# Patient Record
Sex: Female | Born: 1955 | Race: White | Hispanic: No | Marital: Married | Smoking: Never smoker
Health system: Southern US, Community
[De-identification: ages and names within clinical notes are randomized; demographics above are authoritative.]

## PROBLEM LIST (undated history)

## (undated) DIAGNOSIS — K279 Peptic ulcer, site unspecified, unspecified as acute or chronic, without hemorrhage or perforation: Secondary | ICD-10-CM

## (undated) DIAGNOSIS — R0609 Other forms of dyspnea: Secondary | ICD-10-CM

## (undated) DIAGNOSIS — R079 Chest pain, unspecified: Secondary | ICD-10-CM

## (undated) DIAGNOSIS — I10 Essential (primary) hypertension: Secondary | ICD-10-CM

## (undated) DIAGNOSIS — F419 Anxiety disorder, unspecified: Secondary | ICD-10-CM

## (undated) DIAGNOSIS — E039 Hypothyroidism, unspecified: Secondary | ICD-10-CM

## (undated) DIAGNOSIS — R0600 Dyspnea, unspecified: Secondary | ICD-10-CM

## (undated) DIAGNOSIS — E785 Hyperlipidemia, unspecified: Secondary | ICD-10-CM

## (undated) HISTORY — DX: Other forms of dyspnea: R06.09

## (undated) HISTORY — DX: Dyspnea, unspecified: R06.00

## (undated) HISTORY — DX: Hypothyroidism, unspecified: E03.9

## (undated) HISTORY — DX: Chest pain, unspecified: R07.9

## (undated) HISTORY — DX: Hyperlipidemia, unspecified: E78.5

## (undated) HISTORY — DX: Peptic ulcer, site unspecified, unspecified as acute or chronic, without hemorrhage or perforation: K27.9

## (undated) HISTORY — DX: Anxiety disorder, unspecified: F41.9

## (undated) HISTORY — DX: Essential (primary) hypertension: I10

## (undated) HISTORY — PX: EYE SURGERY: SHX253

---

## 2004-05-21 ENCOUNTER — Ambulatory Visit: Payer: Self-pay

## 2005-04-14 ENCOUNTER — Ambulatory Visit: Payer: Self-pay

## 2006-01-07 ENCOUNTER — Ambulatory Visit: Payer: Self-pay | Admitting: Internal Medicine

## 2006-03-23 ENCOUNTER — Encounter: Payer: Self-pay | Admitting: Cardiology

## 2006-03-26 ENCOUNTER — Ambulatory Visit: Payer: Self-pay | Admitting: Internal Medicine

## 2006-03-26 ENCOUNTER — Encounter: Payer: Self-pay | Admitting: Cardiology

## 2007-01-28 ENCOUNTER — Ambulatory Visit: Payer: Self-pay | Admitting: Internal Medicine

## 2007-04-28 HISTORY — PX: CARDIAC CATHETERIZATION: SHX172

## 2007-12-27 ENCOUNTER — Ambulatory Visit: Payer: Self-pay | Admitting: Internal Medicine

## 2008-10-10 ENCOUNTER — Encounter: Payer: Self-pay | Admitting: Cardiology

## 2008-10-16 ENCOUNTER — Ambulatory Visit: Payer: Self-pay | Admitting: Internal Medicine

## 2009-10-29 ENCOUNTER — Encounter: Payer: Self-pay | Admitting: Cardiology

## 2009-11-02 ENCOUNTER — Ambulatory Visit: Payer: Self-pay | Admitting: Internal Medicine

## 2010-03-04 ENCOUNTER — Ambulatory Visit: Payer: Self-pay | Admitting: Cardiology

## 2010-03-04 ENCOUNTER — Encounter: Payer: Self-pay | Admitting: Internal Medicine

## 2010-03-04 DIAGNOSIS — E785 Hyperlipidemia, unspecified: Secondary | ICD-10-CM | POA: Insufficient documentation

## 2010-03-04 DIAGNOSIS — I10 Essential (primary) hypertension: Secondary | ICD-10-CM | POA: Insufficient documentation

## 2010-03-04 DIAGNOSIS — R0789 Other chest pain: Secondary | ICD-10-CM

## 2010-03-04 DIAGNOSIS — R0602 Shortness of breath: Secondary | ICD-10-CM | POA: Insufficient documentation

## 2010-03-10 LAB — CONVERTED CEMR LAB
ALT: 26 units/L (ref 0–35)
AST: 27 units/L (ref 0–37)
Albumin: 4.8 g/dL (ref 3.5–5.2)
Alkaline Phosphatase: 62 units/L (ref 39–117)
Bilirubin, Direct: 0.1 mg/dL (ref 0.0–0.3)
Cholesterol: 180 mg/dL (ref 0–200)
HDL: 61 mg/dL (ref >39)
Indirect Bilirubin: 0.3 mg/dL (ref 0.0–0.9)
LDL Cholesterol: 101 mg/dL — ABNORMAL HIGH (ref 0–99)
Total Bilirubin: 0.4 mg/dL (ref 0.3–1.2)
Total CHOL/HDL Ratio: 3
Total Protein: 7.4 g/dL (ref 6.0–8.3)
Triglycerides: 89 mg/dL (ref <150)
VLDL: 18 mg/dL (ref 0–40)

## 2010-04-01 ENCOUNTER — Telehealth (INDEPENDENT_AMBULATORY_CARE_PROVIDER_SITE_OTHER): Payer: Self-pay | Admitting: *Deleted

## 2010-04-02 ENCOUNTER — Ambulatory Visit: Payer: Self-pay

## 2010-04-02 ENCOUNTER — Encounter: Payer: Self-pay | Admitting: Cardiology

## 2010-04-02 ENCOUNTER — Ambulatory Visit (HOSPITAL_COMMUNITY)
Admission: RE | Admit: 2010-04-02 | Discharge: 2010-04-02 | Payer: Self-pay | Source: Home / Self Care | Attending: Cardiology | Admitting: Cardiology

## 2010-04-07 ENCOUNTER — Ambulatory Visit: Payer: Self-pay | Admitting: Cardiology

## 2010-05-27 NOTE — Letter (Signed)
Summary: [facility] - Exercise Treadmill Test  [facility] - Exercise Treadmill Test   Imported By: Marylou Mccoy 03/19/2010 08:15:43  _____________________________________________________________________  External Attachment:    Type:   Image     Comment:   External Document

## 2010-05-27 NOTE — Assessment & Plan Note (Signed)
Summary: NEW PT   Visit Type:  Initial Consult Primary Provider:  Dr. Randa Lynn  CC:  Cardiac Evaluation for shortness of breath and under arm pain when walking and chest tightness with occas. irreg. heart beat.  She also states had a stress test and cardiac cath in 2009 at [facility] with Dr. Gwen Pounds.  She has experienced a "dropping" sensation when lying down at night..  History of Present Illness: 55 yo with history of HTN, hyperlipidemia presents for evaluation of shortness of breath.  She has been having these episodes for years.  She will suddenly get short of breath.  She can be sitting down in a chair watching television or doing her daily walk.  If she is walking, the shortness of breath will clear up if she just continues walking.  She also gets tightness in the chest when the shortness of breath occurs. This also has been going on for several years.  If she walks fast, she notes that both upper arms seem to ache.  Finally, she occasionally feels like her heart skips a beat.  She is under a significant amount of stress at home and wonders if this is all due to stress.  She had a myoview for similar symptoms back in 2007.  Apparently, this was abnormal, so she had a cath showing normal coronaries.    ECG: NSR, normal  Labs (6/10): LDL 80, HDL 61, K 4.2, creatinine 0.8  Preventive Screening-Counseling & Management  Alcohol-Tobacco     Smoking Status: never  Caffeine-Diet-Exercise     Does Patient Exercise: yes  Current Medications (verified): 1)  Alprazolam 0.25 Mg Tabs (Alprazolam) .... Take 1 Tablet By Mouth Three Times A Day As Needed 2)  Aspirin 81 Mg Tbec (Aspirin) .... Take One Tablet By Mouth Daily 3)  Atenolol 50 Mg Tabs (Atenolol) .... Take 1/2 Tablet Daily 4)  Synthroid 100 Mcg Tabs (Levothyroxine Sodium) .... Take 1 Tablet By Mouth Once A Day 5)  Zocor 20 Mg Tabs (Simvastatin) .... Take 1 Tab By Mouth At Bedtime  Allergies (verified): No Known Drug Allergies  Past  History:  Family History: Last updated: 2010-03-15 Father: Deceased MI at age 42 Mother: Living; age 62 hypertension Siblings: Brother deceased MI age 22  Social History: Last updated: March 15, 2010 Married, lives in [place], 2 children.   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  Regular Exercise - yes--walks 3 x weekly Alcohol Use - yes--wine  Risk Factors: Exercise: yes (March 15, 2010)  Risk Factors: Smoking Status: never (2010-03-15)  Past Medical History: 1. Hyperlipidemia 2. Hypertension 3. Hypothyroidism 4. Distant history of PUD 5. Anxiety 6. Chest pain: Apparently had an abnormal myoview in 2007 followed by LHC in 11/07 with EF 65%, normal coronaries.  Past Surgical History: Cardiac Cath 2009 at [facility]- Normal  Family History: Reviewed history and no changes required. Father: Deceased MI at age 33 Mother: Living; age 35 hypertension Siblings: Brother deceased MI age 42  Social History: Married, lives in [place], 2 children.   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  Regular Exercise - yes--walks 3 x weekly Alcohol Use - yes--wine Smoking Status:  never Does Patient Exercise:  yes  Review of Systems       All systems reviewed and negative except as per HPI.   Vital Signs:  Patient profile:   55 year old female Height:      62 inches Weight:      128 pounds BMI:     23.50 Pulse rate:  68 / minute BP sitting:   130 / 82  (left arm) Cuff size:   regular  Vitals Entered By: Bishop Dublin, CMA (March 04, 2010 11:42 AM)  Physical Exam  General:  Well developed, well nourished, in no acute distress. Head:  normocephalic and atraumatic Nose:  no deformity, discharge, inflammation, or lesions Mouth:  Teeth, gums and palate normal. Oral mucosa normal. Neck:  Neck supple, no JVD. No masses, thyromegaly or abnormal cervical nodes. Lungs:  Clear bilaterally to auscultation and percussion. Heart:  Non-displaced PMI, chest non-tender; regular rate and  rhythm, S1, S2 without murmurs, rubs or gallops. Carotid upstroke normal, no bruit.  Pedals normal pulses. No edema, no varicosities. Abdomen:  Bowel sounds positive; abdomen soft and non-tender without masses, organomegaly, or hernias noted. No hepatosplenomegaly. Extremities:  No clubbing or cyanosis. Neurologic:  Alert and oriented x 3. Skin:  Intact without lesions or rashes. Psych:  Normal affect.   Impression & Recommendations:  Problem # 1:  SHORTNESS OF BREATH (ICD-786.05) Patient has episodes of shortness of breath and chest tightness with both typical and atypical features.  This prompted a workup back in 2007 culminating in a normal cath.  The source of her symptoms may be stress/anxiety.  However, she does have multiple coronary risk factors including hyperlipidemia, HTN, and a family history of premature CAD.  I will have her get a stress echo to assess for ischemia and also assess for any other cardiac cause of shortness of breath (valvular, pulmonary HTN).   Continue ASA for now.   Problem # 2:  HYPERLIPIDEMIA-MIXED (ICD-272.4) No lipid check in over a year, will get today (fasting). She is on Zocor.   Problem # 3:  HYPERTENSION, UNSPECIFIED (ICD-401.9) BP is controlled on current regimen.   Problem # 4:  PALPITATIONS Mild, sound like premature beats.   Other Orders: EKG w/ Interpretation (93000) T-Lipid Profile ([id number]) T-Hepatic Function [phone]) Stress Echo (Stress Echo) Echocardiogram (Echo)  Patient Instructions: 1)  Your physician recommends that you schedule a follow-up appointment in: 2 weeks. 2)  Your physician recommends that you have lab work today in office. (Lipid/Liver) 3)  Your physician has requested that you have a stress echocardiogram. For further information please visit [email or web address]  Please follow instruction sheet as given. 4)  Your physician has requested that you have an echocardiogram.  Echocardiography is a painless test that  uses sound waves to create images of your heart. It provides your doctor with information about the size and shape of your heart and how well your heart's chambers and valves are working.  This procedure takes approximately one hour. There are no restrictions for this procedure.  Appended Document: NEW PT lipids look good  Appended Document: NEW PT LMOM TCB.

## 2010-05-27 NOTE — Letter (Signed)
Summary: [facility] - Internal Medicine  [facility] - Internal Medicine   Imported By: Marylou Mccoy 03/19/2010 08:17:28  _____________________________________________________________________  External Attachment:    Type:   Image     Comment:   External Document

## 2010-05-27 NOTE — Letter (Signed)
[facility]   [facility] Cath Report   Imported By: Roderic Ovens 03/10/2010 09:54:24  _____________________________________________________________________  External Attachment:    Type:   Image     Comment:   External Document

## 2010-05-27 NOTE — Letter (Signed)
Summary: Medical Record Release  Medical Record Release   Imported By: Harlon Flor 03/05/2010 10:47:54  _____________________________________________________________________  External Attachment:    Type:   Image     Comment:   External Document

## 2010-05-27 NOTE — Progress Notes (Signed)
Summary: Stress Echo pre procedure  Phone Note Outgoing Call Call back at Home Phone [phone]   Call placed by: Rea College, CMA,  April 01, 2010 3:38 PM Call placed to: Patient Summary of Call: Called and spoke with patient about instructions for stress echocardiogram on 04/02/10 @ 3:00 pm.  Called back and left message that she needs to be here a 2:00 pm for a baseline echo.

## 2010-05-29 NOTE — Assessment & Plan Note (Signed)
Summary: F2W/AMD   Visit Type:  Follow-up Primary Gina Cobb:  Dr. Randa Cobb  CC:  2 week f/u. c/o occasional SOB.Marland Kitchen  History of Present Illness: 55 yo with history of HTN and hyperlipidemia returns for evaluation of shortness of breath.  She has been having these episodes for years.  She will suddenly get short of breath.  She can be sitting down in a chair watching television or doing her daily walk.  If she is walking, the shortness of breath will clear up if she just continues walking.  She also sometimes gets mild tightness in the chest when the shortness of breath occurs. This also has been going on for several years.  If she walks fast, she notes that both upper arms seem to ache.  Finally, she occasionally feels like her heart skips a beat.  She is under a significant amount of stress at home and wonders if this is all due to stress.  She had a myoview for similar symptoms back in 2007.  Apparently, this was abnormal, so she had a cath showing normal coronaries.    I had her do a stress echocardiogram.  There were some ST changes on stress ECG but echo images were normal with exertion.  I think that this was a normal study.  Additionally, there were no significant valvular lesions and no pulmonary hypertension on the echo.    Labs (6/10): LDL 80, HDL 61, K 4.2, creatinine 0.8 Labs (11/11): LDL 101, HDL 61  Current Medications (verified): 1)  Alprazolam 0.25 Mg Tabs (Alprazolam) .... Take 1 Tablet By Mouth Three Times A Day As Needed 2)  Aspirin 81 Mg Tbec (Aspirin) .... Take One Tablet By Mouth Daily 3)  Atenolol 50 Mg Tabs (Atenolol) .... Take 1/2 Tablet Daily 4)  Synthroid 100 Mcg Tabs (Levothyroxine Sodium) .... Take 1 Tablet By Mouth Once A Day 5)  Zocor 20 Mg Tabs (Simvastatin) .... Take 1 Tab By Mouth At Bedtime  Allergies (verified): No Known Drug Allergies  Past History:  Past Surgical History: Last updated: Mar 16, 2010 Cardiac Cath 2009 at [facility]- Normal  Family History: Last  updated: Mar 16, 2010 Father: Deceased MI at age 8 Mother: Living; age 57 hypertension Siblings: Brother deceased MI age 36  Social History: Last updated: 03/16/10 Married, lives in [place], 2 children.   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  Regular Exercise - yes--walks 3 x weekly Alcohol Use - yes--wine  Risk Factors: Exercise: yes (03/16/2010)  Risk Factors: Smoking Status: never (March 16, 2010)  Past Medical History: 1. Hyperlipidemia 2. Hypertension 3. Hypothyroidism 4. Distant history of PUD 5. Anxiety 6. Chest pain, dyspnea: Apparently had an abnormal myoview in 2007 followed by LHC in 11/07 with EF 65%, normal coronaries.  Stress echo (12/11): Baseline echo with EF 60-65%, normal diastolic function, no pulmonary hypertension, no significant valvular abnormalities; stress ECG showed ST changes but stress echo images were normal, suspect that this was a negative study.   Family History: Reviewed history from 16-Mar-2010 and no changes required. Father: Deceased MI at age 53 Mother: Living; age 62 hypertension Siblings: Brother deceased MI age 35  Social History: Reviewed history from 2010/03/16 and no changes required. Married, lives in [place], 2 children.   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  Regular Exercise - yes--walks 3 x weekly Alcohol Use - yes--wine  Vital Signs:  Patient profile:   55 year old female Height:      62 inches Weight:      128 pounds BMI:  23.50 Pulse rate:   72 / minute BP sitting:   104 / 68  (left arm) Cuff size:   regular  Vitals Entered By: Lysbeth Galas CMA (April 07, 2010 4:02 PM)  Physical Exam  General:  Well developed, well nourished, in no acute distress. Neck:  Neck supple, no JVD. No masses, thyromegaly or abnormal cervical nodes. Lungs:  Clear bilaterally to auscultation and percussion. Heart:  Non-displaced PMI, chest non-tender; regular rate and rhythm, S1, S2 without murmurs, rubs or gallops.  Carotid upstroke normal, no bruit.  Pedals normal pulses. No edema, no varicosities. Abdomen:  Bowel sounds positive; abdomen soft and non-tender without masses, organomegaly, or hernias noted. No hepatosplenomegaly. Extremities:  No clubbing or cyanosis. Neurologic:  Alert and oriented x 3. Psych:  Normal affect.   Impression & Recommendations:  Problem # 1:  SHORTNESS OF BREATH (ICD-786.05) Patient has had episodes of shortness of breath and chest tightness with both typical and atypical features that have been present periodically for several years.  This prompted a workup back in 2007 culminating in a normal cath.  Stress echo showed normal EF, no significant valvular abnormalities, no pulmonary hypertension.  Stress echo images were normal.  Low risk study.  The patient's symptoms could be related to anxiety and stress.  I encouraged her to get regular exercise and to let us know if symptoms change or get worse. She takes ASA 81 mg daily.   Problem # 2:  HYPERLIPIDEMIA-MIXED (ICD-272.4) Current lipids on simvastatin are adequate.

## 2010-11-18 ENCOUNTER — Encounter: Payer: Self-pay | Admitting: Cardiology

## 2011-01-08 ENCOUNTER — Ambulatory Visit: Payer: Self-pay | Admitting: Internal Medicine

## 2012-01-29 ENCOUNTER — Ambulatory Visit: Payer: Self-pay | Admitting: Internal Medicine

## 2012-05-31 ENCOUNTER — Ambulatory Visit: Payer: Self-pay | Admitting: Internal Medicine

## 2012-09-25 HISTORY — PX: BREAST EXCISIONAL BIOPSY: SUR124

## 2012-09-27 ENCOUNTER — Ambulatory Visit: Payer: Self-pay | Admitting: Internal Medicine

## 2012-10-17 ENCOUNTER — Ambulatory Visit: Payer: Self-pay | Admitting: Surgery

## 2013-05-15 ENCOUNTER — Ambulatory Visit: Payer: Self-pay | Admitting: Surgery

## 2013-11-10 ENCOUNTER — Ambulatory Visit: Payer: Self-pay | Admitting: Surgery

## 2013-11-23 ENCOUNTER — Ambulatory Visit: Payer: Self-pay | Admitting: Surgery

## 2013-11-23 LAB — CBC WITH DIFFERENTIAL/PLATELET
BASOS PCT: 0.9 %
Basophil #: 0.1 10*3/uL (ref 0.0–0.1)
Eosinophil #: 0.1 10*3/uL (ref 0.0–0.7)
Eosinophil %: 1.2 %
HCT: 38.8 % (ref 35.0–47.0)
HGB: 12.8 g/dL (ref 12.0–16.0)
Lymphocyte #: 2 10*3/uL (ref 1.0–3.6)
Lymphocyte %: 35.5 %
MCH: 29.6 pg (ref 26.0–34.0)
MCHC: 32.9 g/dL (ref 32.0–36.0)
MCV: 90 fL (ref 80–100)
MONO ABS: 0.5 x10 3/mm (ref 0.2–0.9)
Monocyte %: 7.8 %
NEUTROS ABS: 3.1 10*3/uL (ref 1.4–6.5)
Neutrophil %: 54.6 %
PLATELETS: 250 10*3/uL (ref 150–440)
RBC: 4.32 10*6/uL (ref 3.80–5.20)
RDW: 12.4 % (ref 11.5–14.5)
WBC: 5.8 10*3/uL (ref 3.6–11.0)

## 2013-11-23 LAB — COMPREHENSIVE METABOLIC PANEL
ANION GAP: 6 — AB (ref 7–16)
Albumin: 3.7 g/dL (ref 3.4–5.0)
Alkaline Phosphatase: 59 U/L
BUN: 12 mg/dL (ref 7–18)
Bilirubin,Total: 0.3 mg/dL (ref 0.2–1.0)
Calcium, Total: 8.8 mg/dL (ref 8.5–10.1)
Chloride: 107 mmol/L (ref 98–107)
Co2: 29 mmol/L (ref 21–32)
Creatinine: 0.78 mg/dL (ref 0.60–1.30)
EGFR (African American): >60
Glucose: 89 mg/dL (ref 65–99)
Osmolality: 282 (ref 275–301)
Potassium: 4.1 mmol/L (ref 3.5–5.1)
SGOT(AST): 31 U/L (ref 15–37)
SGPT (ALT): 27 U/L
Sodium: 142 mmol/L (ref 136–145)
Total Protein: 7.3 g/dL (ref 6.4–8.2)

## 2013-11-30 ENCOUNTER — Ambulatory Visit: Payer: Self-pay | Admitting: Surgery

## 2013-12-01 LAB — PATHOLOGY REPORT

## 2014-08-18 NOTE — Op Note (Signed)
PATIENT NAME:  Gina Cobb, Gina Cobb MR#:  [id number] DATE OF BIRTH:  Mar 16, 1956  DATE OF PROCEDURE:  11/30/2013  PREOPERATIVE DIAGNOSIS: Left breast mass.   POSTOPERATIVE DIAGNOSIS: Left breast mass.   PROCEDURE: Excision of left breast mass.   SURGEON: Rochel Brome, M.D.   ANESTHESIA: General.   INDICATIONS: This 59 year old female has a history of a nodule in the upper outer quadrant of the left breast. Core biopsy demonstrated atypical ductal hyperplasia and excision was recommended for further evaluation and treatment.   DESCRIPTION OF PROCEDURE: The patient did have preoperative insertion of a Kopans wire. I reviewed her mammogram images demonstrating the proximity of the barb and the biopsy marker.   PROCEDURE: The patient was placed on the operating table in the supine position under general anesthesia. The dressing was removed from the left breast exposing the Kopans wire in the upper aspect of the breast, and it appeared that the barb was somewhat lateral to the 12:00 position. The wire was cut 2 cm from the skin. The breast was prepared with ChloraPrep and draped in a sterile manner.   A curvilinear incision was made from approximately 12:00 to 2:00 position, approximately 5 cm from the nipple, carried down through subcutaneous tissues.  Electrocautery was used for hemostasis. The wire was encountered and removed. A portion of tissue surrounding the distal portion of the wire including the barb. This portion of tissue was approximately 2 x 2 x 3 cm in dimension, and did have some firmness within the central aspect of it. This was submitted for specimen mammogram and routine pathology.   The wound was inspected. Several tiny bleeding points were cauterized. Hemostasis appeared to be intact. Deeper tissues were infiltrated with 0.5% Sensorcaine with epinephrine and also subcutaneous tissues were infiltrated. The subcutaneous tissues were approximated with 4-0 chromic and then the skin was closed  with a running 4-0 Monocryl subcuticular suture and Dermabond.   It is noted the radiologist called back to report that the biopsy marker was seen within the specimen, and the specimen has been submitted for routine pathology The patient appeared to tolerate the procedure satisfactorily and was then prepared for transfer to the recovery room.   ____________________________ Lenna Sciara. Rochel Brome, MD jws:ts D: 11/30/2013 12:23:04 ET T: 11/30/2013 18:22:31 ET JOB#: [id number]  cc: Loreli Dollar, MD, <Dictator> Loreli Dollar MD ELECTRONICALLY SIGNED 12/07/2013 18:32

## 2014-11-20 ENCOUNTER — Emergency Department
Admission: EM | Admit: 2014-11-20 | Discharge: 2014-11-20 | Disposition: A | Discharge status: Home / Self Care | Payer: Self-pay | Attending: Emergency Medicine | Admitting: Emergency Medicine

## 2014-11-20 ENCOUNTER — Emergency Department: Payer: Self-pay

## 2014-11-20 ENCOUNTER — Encounter: Payer: Self-pay | Admitting: Student

## 2014-11-20 DIAGNOSIS — I10 Essential (primary) hypertension: Secondary | ICD-10-CM | POA: Insufficient documentation

## 2014-11-20 DIAGNOSIS — R197 Diarrhea, unspecified: Secondary | ICD-10-CM | POA: Insufficient documentation

## 2014-11-20 DIAGNOSIS — R1031 Right lower quadrant pain: Secondary | ICD-10-CM | POA: Insufficient documentation

## 2014-11-20 LAB — URINALYSIS COMPLETE WITH MICROSCOPIC (ARMC ONLY)
Bilirubin Urine: NEGATIVE
GLUCOSE, UA: NEGATIVE mg/dL
Hgb urine dipstick: NEGATIVE
Ketones, ur: NEGATIVE mg/dL
Nitrite: NEGATIVE
PH: 8 (ref 5.0–8.0)
Protein, ur: NEGATIVE mg/dL
Specific Gravity, Urine: 1.002 — ABNORMAL LOW (ref 1.005–1.030)

## 2014-11-20 LAB — COMPREHENSIVE METABOLIC PANEL
ALBUMIN: 5.1 g/dL — AB (ref 3.5–5.0)
ALK PHOS: 59 U/L (ref 38–126)
ALT: 19 U/L (ref 14–54)
AST: 30 U/L (ref 15–41)
Anion gap: 11 (ref 5–15)
BILIRUBIN TOTAL: 0.6 mg/dL (ref 0.3–1.2)
BUN: 12 mg/dL (ref 6–20)
CALCIUM: 9.4 mg/dL (ref 8.9–10.3)
CHLORIDE: 98 mmol/L — AB (ref 101–111)
CO2: 28 mmol/L (ref 22–32)
CREATININE: 0.89 mg/dL (ref 0.44–1.00)
GFR calc Af Amer: >60 mL/min (ref >60)
Glucose, Bld: 88 mg/dL (ref 65–99)
Potassium: 3.6 mmol/L (ref 3.5–5.1)
SODIUM: 137 mmol/L (ref 135–145)
Total Protein: 8.2 g/dL — ABNORMAL HIGH (ref 6.5–8.1)

## 2014-11-20 LAB — CBC
HEMATOCRIT: 42.1 % (ref 35.0–47.0)
Hemoglobin: 13.8 g/dL (ref 12.0–16.0)
MCH: 28.6 pg (ref 26.0–34.0)
MCHC: 32.8 g/dL (ref 32.0–36.0)
MCV: 87.4 fL (ref 80.0–100.0)
Platelets: 247 10*3/uL (ref 150–440)
RBC: 4.82 MIL/uL (ref 3.80–5.20)
RDW: 12.9 % (ref 11.5–14.5)
WBC: 8.8 10*3/uL (ref 3.6–11.0)

## 2014-11-20 LAB — LIPASE, BLOOD: Lipase: 33 U/L (ref 22–51)

## 2014-11-20 MED ORDER — IOHEXOL 300 MG/ML  SOLN: 100.0000 mL | Freq: Once | INTRAMUSCULAR | Status: DC | PRN

## 2014-11-20 MED ORDER — DICYCLOMINE HCL 20 MG PO TABS: 20.0000 mg | ORAL_TABLET | Freq: Three times a day (TID) | ORAL | Status: DC | PRN

## 2014-11-20 MED ORDER — ONDANSETRON HCL 4 MG PO TABS: 4.0000 mg | ORAL_TABLET | Freq: Every day | ORAL | Status: DC | PRN

## 2014-11-20 MED ORDER — SODIUM CHLORIDE 0.9 % IV SOLN
Freq: Once | INTRAVENOUS | Status: AC
  Administered 2014-11-20: 20:00:00 via INTRAVENOUS

## 2014-11-20 MED ORDER — IOHEXOL 240 MG/ML SOLN
50.0000 mL | INTRAMUSCULAR | Status: AC
  Administered 2014-11-20: 50 mL via ORAL
  Filled 2014-11-20 (×2): qty 50

## 2014-11-20 NOTE — ED Notes (Signed)
Pt reports feeling weak for a few days. Abdominal pain began three days ago. Pt states pain is to the right of the umbilicus. Pt reports nausea and diarrhea.  Denies vomiting.

## 2014-11-20 NOTE — Discharge Instructions (Signed)
Abdominal Pain °Many things can cause abdominal pain. Usually, abdominal pain is not caused by a disease and will improve without treatment. It can often be observed and treated at home. Your health care provider will do a physical exam and possibly order blood tests and X-rays to help determine the seriousness of your pain. However, in many cases, more time must pass before a clear cause of the pain can be found. Before that point, your health care provider may not know if you need more testing or further treatment. °HOME CARE INSTRUCTIONS  °Monitor your abdominal pain for any changes. The following actions may help to alleviate any discomfort you are experiencing: °· Only take over-the-counter or prescription medicines as directed by your health care provider. °· Do not take laxatives unless directed to do so by your health care provider. °· Try a clear liquid diet (broth, tea, or water) as directed by your health care provider. Slowly move to a bland diet as tolerated. °SEEK MEDICAL CARE IF: °· You have unexplained abdominal pain. °· You have abdominal pain associated with nausea or diarrhea. °· You have pain when you urinate or have a bowel movement. °· You experience abdominal pain that wakes you in the night. °· You have abdominal pain that is worsened or improved by eating food. °· You have abdominal pain that is worsened with eating fatty foods. °· You have a fever. °SEEK IMMEDIATE MEDICAL CARE IF:  °· Your pain does not go away within 2 hours. °· You keep throwing up (vomiting). °· Your pain is felt only in portions of the abdomen, such as the right side or the left lower portion of the abdomen. °· You pass bloody or black tarry stools. °MAKE SURE YOU: °· Understand these instructions.   °· Will watch your condition.   °· Will get help right away if you are not doing well or get worse.   °Document Released: 01/21/2005 Document Revised: 04/18/2013 Document Reviewed: 12/21/2012 °ExitCare® Patient Information  ©2015 [employer]. This information is not intended to replace advice given to you by your health care provider. Make sure you discuss any questions you have with your health care provider. ° °Diarrhea °Diarrhea is frequent loose and watery bowel movements. It can cause you to feel weak and dehydrated. Dehydration can cause you to become tired and thirsty, have a dry mouth, and have decreased urination that often is dark yellow. Diarrhea is a sign of another problem, most often an infection that will not last long. In most cases, diarrhea typically lasts 2-3 days. However, it can last longer if it is a sign of something more serious. It is important to treat your diarrhea as directed by your caregiver to lessen or prevent future episodes of diarrhea. °CAUSES  °Some common causes include: °· Gastrointestinal infections caused by viruses, bacteria, or parasites. °· Food poisoning or food allergies. °· Certain medicines, such as antibiotics, chemotherapy, and laxatives. °· Artificial sweeteners and fructose. °· Digestive disorders. °HOME CARE INSTRUCTIONS °· Ensure adequate fluid intake (hydration): Have 1 cup (8 oz) of fluid for each diarrhea episode. Avoid fluids that contain simple sugars or sports drinks, fruit juices, whole milk products, and sodas. Your urine should be clear or pale yellow if you are drinking enough fluids. Hydrate with an oral rehydration solution that you can purchase at pharmacies, retail stores, and online. You can prepare an oral rehydration solution at home by mixing the following ingredients together: °¨  - tsp table salt. °¨ ¾ tsp baking soda. °¨    tsp salt substitute containing potassium chloride.  1  tablespoons sugar.  1 L (34 oz) of water.  Certain foods and beverages may increase the speed at which food moves through the gastrointestinal (GI) tract. These foods and beverages should be avoided and include:  Caffeinated and alcoholic beverages.  High-fiber foods, such as raw  fruits and vegetables, nuts, seeds, and whole grain breads and cereals.  Foods and beverages sweetened with sugar alcohols, such as xylitol, sorbitol, and mannitol.  Some foods may be well tolerated and may help thicken stool including:  Starchy foods, such as rice, toast, pasta, low-sugar cereal, oatmeal, grits, baked potatoes, crackers, and bagels.  Bananas.  Applesauce.  Add probiotic-rich foods to help increase healthy bacteria in the GI tract, such as yogurt and fermented milk products.  Wash your hands well after each diarrhea episode.  Only take over-the-counter or prescription medicines as directed by your caregiver.  Take a warm bath to relieve any burning or pain from frequent diarrhea episodes. SEEK IMMEDIATE MEDICAL CARE IF:   You are unable to keep fluids down.  You have persistent vomiting.  You have blood in your stool, or your stools are black and tarry.  You do not urinate in 6-8 hours, or there is only a small amount of very dark urine.  You have abdominal pain that increases or localizes.  You have weakness, dizziness, confusion, or light-headedness.  You have a severe headache.  Your diarrhea gets worse or does not get better.  You have a fever or persistent symptoms for more than 2-3 days.  You have a fever and your symptoms suddenly get worse. MAKE SURE YOU:   Understand these instructions.  Will watch your condition.  Will get help right away if you are not doing well or get worse. Document Released: 04/03/2002 Document Revised: 08/28/2013 Document Reviewed: 12/20/2011 [facility] Patient Information 2015 [place], [place]. This information is not intended to replace advice given to you by your health care provider. Make sure you discuss any questions you have with your health care provider.  Diet and Irritable Bowel Syndrome  No cure has been found for irritable bowel syndrome (IBS). Many options are available to treat the symptoms. Your caregiver  will give you the best treatments available for your symptoms. He or she will also encourage you to manage stress and to make changes to your diet. You need to work with your caregiver and Registered Dietician to find the best combination of medicine, diet, counseling, and support to control your symptoms. The following are some diet suggestions. FOODS THAT MAKE IBS WORSE  Fatty foods, such as [place] fries.  Milk products, such as cheese or ice cream.  Chocolate.  Alcohol.  Caffeine (found in coffee and some sodas).  Carbonated drinks, such as soda. If certain foods cause symptoms, you should eat less of them or stop eating them. FOOD JOURNAL   Keep a journal of the foods that seem to cause distress. Write down:  What you are eating during the day and when.  What problems you are having after eating.  When the symptoms occur in relation to your meals.  What foods always make you feel badly.  Take your notes with you to your caregiver to see if you should stop eating certain foods. FOODS THAT MAKE IBS BETTER Fiber reduces IBS symptoms, especially constipation, because it makes stools soft, bulky, and easier to pass. Fiber is found in bran, bread, cereal, beans, fruit, and vegetables. Examples of foods with  fiber include:  Apples.  Peaches.  Pears.  Berries.  Figs.  Broccoli, raw.  Cabbage.  Carrots.  Raw peas.  Kidney beans.  Lima beans.  Whole-grain bread.  Whole-grain cereal. Add foods with fiber to your diet a little at a time. This will let your body get used to them. Too much fiber at once might cause gas and swelling of your abdomen. This can trigger symptoms in a person with IBS. Caregivers usually recommend a diet with enough fiber to produce soft, painless bowel movements. High fiber diets may cause gas and bloating. However, these symptoms often go away within a few weeks, as your body adjusts. In many cases, dietary fiber may lessen IBS symptoms,  particularly constipation. However, it may not help pain or diarrhea. High fiber diets keep the colon mildly enlarged (distended) with the added fiber. This may help prevent spasms in the colon. Some forms of fiber also keep water in the stool, thereby preventing hard stools that are difficult to pass.  Besides telling you to eat more foods with fiber, your caregiver may also tell you to get more fiber by taking a fiber pill or drinking water mixed with a special high fiber powder. An example of this is a natural fiber laxative containing psyllium seed.  TIPS  Large meals can cause cramping and diarrhea in people with IBS. If this happens to you, try eating 4 or 5 small meals a day, or try eating less at each of your usual 3 meals. It may also help if your meals are low in fat and high in carbohydrates. Examples of carbohydrates are pasta, rice, whole-grain breads and cereals, fruits, and vegetables.  If dairy products cause your symptoms to flare up, you can try eating less of those foods. You might be able to handle yogurt better than other dairy products, because it contains bacteria that helps with digestion. Dairy products are an important source of calcium and other nutrients. If you need to avoid dairy products, be sure to talk with a Registered Dietitian about getting these nutrients through other food sources.  Drink enough water and fluids to keep your urine clear or pale yellow. This is important, especially if you have diarrhea. FOR MORE INFORMATION  International Foundation for Functional Gastrointestinal Disorders: www.iffgd.org  National Digestive Diseases Information Clearinghouse: digestive.AmenCredit.is Document Released: 07/04/2003 Document Revised: 07/06/2011 Document Reviewed: 07/14/2013 [facility] Patient Information 2015 [place], [place]. This information is not intended to replace advice given to you by your health care provider. Make sure you discuss any questions you have with  your health care provider.

## 2014-11-20 NOTE — ED Provider Notes (Signed)
[facility] Emergency Department Provider Note     Time seen: ----------------------------------------- 7:28 PM on 11/20/2014 -----------------------------------------    I have reviewed the triage vital signs and the nursing notes.   HISTORY  Chief Complaint Abdominal Pain    HPI Gina Cobb is a 59 y.o. female who presents to ER with right lower quadrant pain. She states it started 3 days ago and it was sharp, now is more of a dull pain that comes and goes. Nothing makes it better or worse. She does report significant nausea and diarrhea, denies any vomiting. She has had sweats but no fever. Pain is mild at this time.   Past Medical History  Diagnosis Date  . Hyperlipidemia   . Hypertension   . Hypothyroidism   . PUD (peptic ulcer disease)     distant history  . Anxiety   . Chest pain   . Dyspnea     Apparently had an abnormal myoview in 2007 followed by [facility] in 11/07 with EF 65%, normal coronaries. Stress echo (12/11): Baseline echo with EF 76-28%, normal diastolic function, no pulmonary hypertension, no significant valvular abnormalities; stress ECG showed ST changes but stress echo images were normal, suspect that this was a negative study    Patient Active Problem List   Diagnosis Date Noted  . HYPERLIPIDEMIA-MIXED 03/04/2010  . HYPERTENSION, BENIGN 03/04/2010  . HYPERTENSION, UNSPECIFIED 03/04/2010  . SHORTNESS OF BREATH 03/04/2010  . OTHER CHEST PAIN 03/04/2010    Past Surgical History  Procedure Laterality Date  . Cardiac catheterization  2009    at [facility]- normal  . Breast lumpectomy      Allergies Review of patient's allergies indicates no known allergies.  Social History History  Substance Use Topics  . Smoking status: Never Smoker   . Smokeless tobacco: Not on file  . Alcohol Use: Yes     Comment: occasional beer    Review of Systems Constitutional: Negative for fever. Eyes: Negative for visual changes. ENT:  Negative for sore throat. Cardiovascular: Negative for chest pain. Respiratory: Negative for shortness of breath. Gastrointestinal: Positive for abdominal pain and diarrhea Genitourinary: Negative for dysuria. Musculoskeletal: Negative for back pain. Skin: Negative for rash. Neurological: Negative for headaches, positive for weakness  10-point ROS otherwise negative.  ____________________________________________   PHYSICAL EXAM:  VITAL SIGNS: ED Triage Vitals  Enc Vitals Group     BP 11/20/14 1854 174/72 mmHg     Pulse Rate 11/20/14 1854 78     Resp 11/20/14 1854 18     Temp 11/20/14 1854 99 F (37.2 C)     Temp Source 11/20/14 1854 Oral     SpO2 11/20/14 1854 99 %     Weight 11/20/14 1854 119 lb (53.978 kg)     Height 11/20/14 1854 5\' 2"  (1.575 m)     Head Cir --      Peak Flow --      Pain Score 11/20/14 1849 4     Pain Loc --      Pain Edu? --      Excl. in [place]? --     Constitutional: Alert and oriented. Well appearing and in no distress. Eyes: Conjunctivae are normal. PERRL. Normal extraocular movements. ENT   Head: Normocephalic and atraumatic.   Nose: No congestion/rhinnorhea.   Mouth/Throat: Mucous membranes are moist.   Neck: No stridor. Cardiovascular: Normal rate, regular rhythm. Normal and symmetric distal pulses are present in all extremities. No murmurs, rubs, or gallops. Respiratory:  Normal respiratory effort without tachypnea nor retractions. Breath sounds are clear and equal bilaterally. No wheezes/rales/rhonchi. Gastrointestinal: Hyperactive bowel sounds, McBurney point tenderness, specific right lower quadrant tenderness with no other tenderness in the abdomen. No rebound or guarding. Musculoskeletal: Nontender with normal range of motion in all extremities. No joint effusions.  No lower extremity tenderness nor edema. Neurologic:  Normal speech and language. No gross focal neurologic deficits are appreciated. Speech is normal. No gait  instability. Skin:  Skin is warm, dry and intact. No rash noted. Psychiatric: Mood and affect are normal. Speech and behavior are normal. Patient exhibits appropriate insight and judgment.  ____________________________________________  ED COURSE:  Pertinent labs & imaging results that were available during my care of the patient were reviewed by me and considered in my medical decision making (see chart for details). Patient with specific right lower quadrant tenderness, will need CT scan. ____________________________________________    LABS (pertinent positives/negatives)  Labs Reviewed  COMPREHENSIVE METABOLIC PANEL - Abnormal; Notable for the following:    Chloride 98 (*)    Total Protein 8.2 (*)    Albumin 5.1 (*)    All other components within normal limits  URINALYSIS COMPLETEWITH MICROSCOPIC ([facility] ONLY) - Abnormal; Notable for the following:    Color, Urine COLORLESS (*)    APPearance CLEAR (*)    Specific Gravity, Urine 1.002 (*)    Leukocytes, UA 3+ (*)    Bacteria, UA RARE (*)    Squamous Epithelial / LPF 0-5 (*)    All other components within normal limits  LIPASE, BLOOD  CBC    RADIOLOGY Images were viewed by me  CT abdomen and pelvis  IMPRESSION: No acute findings. No abnormalities to account for the patient's symptoms.  Nonacute findings as described above.  ____________________________________________  FINAL ASSESSMENT AND PLAN  Abdominal pain, diarrhea  Plan: Patient with labs and imaging as dictated above. Specific right lower quadrant pain is likely intestinal spasm. She'll be discharged with Bentyl and Zofran to take as needed. She is encouraged follow-up the doctor the next 48 hours for reevaluation.   Earleen Newport, MD   Earleen Newport, MD 11/20/14 2200

## 2015-07-15 ENCOUNTER — Other Ambulatory Visit: Payer: Self-pay | Admitting: Family Medicine

## 2015-07-15 DIAGNOSIS — Z1231 Encounter for screening mammogram for malignant neoplasm of breast: Secondary | ICD-10-CM

## 2015-07-18 ENCOUNTER — Ambulatory Visit
Admission: RE | Admit: 2015-07-18 | Discharge: 2015-07-18 | Disposition: A | Discharge status: Home / Self Care | Payer: Managed Care, Other (non HMO) | Source: Ambulatory Visit | Attending: Family Medicine | Admitting: Family Medicine

## 2015-07-18 ENCOUNTER — Other Ambulatory Visit: Payer: Self-pay | Admitting: Family Medicine

## 2015-07-18 DIAGNOSIS — Z1231 Encounter for screening mammogram for malignant neoplasm of breast: Secondary | ICD-10-CM

## 2016-11-26 ENCOUNTER — Other Ambulatory Visit: Payer: Self-pay | Admitting: Family Medicine

## 2016-11-26 DIAGNOSIS — Z1231 Encounter for screening mammogram for malignant neoplasm of breast: Secondary | ICD-10-CM

## 2016-12-16 ENCOUNTER — Ambulatory Visit
Admission: RE | Admit: 2016-12-16 | Discharge: 2016-12-16 | Disposition: A | Discharge status: Home / Self Care | Payer: BLUE CROSS/BLUE SHIELD | Source: Ambulatory Visit | Attending: Family Medicine | Admitting: Family Medicine

## 2016-12-16 DIAGNOSIS — Z1231 Encounter for screening mammogram for malignant neoplasm of breast: Secondary | ICD-10-CM | POA: Insufficient documentation

## 2018-04-25 ENCOUNTER — Other Ambulatory Visit: Payer: Self-pay | Admitting: Family Medicine

## 2018-04-25 DIAGNOSIS — Z1231 Encounter for screening mammogram for malignant neoplasm of breast: Secondary | ICD-10-CM

## 2018-05-19 ENCOUNTER — Ambulatory Visit
Admission: RE | Admit: 2018-05-19 | Discharge: 2018-05-19 | Disposition: A | Discharge status: Home / Self Care | Payer: BLUE CROSS/BLUE SHIELD | Source: Ambulatory Visit | Attending: Family Medicine | Admitting: Family Medicine

## 2018-05-19 DIAGNOSIS — Z1231 Encounter for screening mammogram for malignant neoplasm of breast: Secondary | ICD-10-CM | POA: Insufficient documentation

## 2018-12-20 DIAGNOSIS — M720 Palmar fascial fibromatosis [Dupuytren]: Secondary | ICD-10-CM | POA: Insufficient documentation

## 2019-04-27 ENCOUNTER — Other Ambulatory Visit: Payer: Self-pay | Admitting: Family Medicine

## 2019-04-27 DIAGNOSIS — Z1231 Encounter for screening mammogram for malignant neoplasm of breast: Secondary | ICD-10-CM

## 2019-05-24 ENCOUNTER — Ambulatory Visit
Admission: RE | Admit: 2019-05-24 | Discharge: 2019-05-24 | Disposition: A | Discharge status: Home / Self Care | Payer: BC Managed Care – PPO | Source: Ambulatory Visit | Attending: Family Medicine | Admitting: Family Medicine

## 2019-05-24 DIAGNOSIS — Z1231 Encounter for screening mammogram for malignant neoplasm of breast: Secondary | ICD-10-CM | POA: Diagnosis not present

## 2019-10-13 ENCOUNTER — Other Ambulatory Visit: Payer: Self-pay | Admitting: Otolaryngology

## 2019-10-13 DIAGNOSIS — R432 Parageusia: Secondary | ICD-10-CM

## 2019-10-25 ENCOUNTER — Other Ambulatory Visit: Payer: Self-pay | Admitting: Otolaryngology

## 2019-10-25 DIAGNOSIS — R221 Localized swelling, mass and lump, neck: Secondary | ICD-10-CM

## 2019-10-25 DIAGNOSIS — R1310 Dysphagia, unspecified: Secondary | ICD-10-CM

## 2019-10-26 ENCOUNTER — Ambulatory Visit: Payer: Medicare Other

## 2019-11-07 ENCOUNTER — Other Ambulatory Visit: Payer: Self-pay

## 2019-11-07 ENCOUNTER — Encounter: Payer: Self-pay | Admitting: Gastroenterology

## 2019-11-07 ENCOUNTER — Ambulatory Visit (INDEPENDENT_AMBULATORY_CARE_PROVIDER_SITE_OTHER): Payer: Medicare Other | Admitting: Gastroenterology

## 2019-11-07 VITALS — BP 153/73 | HR 89 | Temp 98.2°F | Ht 62.0 in | Wt 110.1 lb

## 2019-11-07 DIAGNOSIS — R1013 Epigastric pain: Secondary | ICD-10-CM | POA: Diagnosis not present

## 2019-11-07 DIAGNOSIS — F419 Anxiety disorder, unspecified: Secondary | ICD-10-CM | POA: Insufficient documentation

## 2019-11-07 DIAGNOSIS — R1310 Dysphagia, unspecified: Secondary | ICD-10-CM | POA: Diagnosis not present

## 2019-11-07 DIAGNOSIS — R51 Headache: Secondary | ICD-10-CM | POA: Insufficient documentation

## 2019-11-07 DIAGNOSIS — R519 Headache, unspecified: Secondary | ICD-10-CM | POA: Insufficient documentation

## 2019-11-07 DIAGNOSIS — R634 Abnormal weight loss: Secondary | ICD-10-CM

## 2019-11-07 DIAGNOSIS — R1319 Other dysphagia: Secondary | ICD-10-CM

## 2019-11-07 NOTE — Progress Notes (Signed)
Cephas Darby, MD [address]  [place], [place] [address]  Main: [phone]  Fax: [phone]    Gastroenterology Consultation  Referring Provider:     Derinda Late, MD Primary Care Physician:  Derinda Late, MD Primary Gastroenterologist:  Dr. Cephas Darby Reason for Consultation:   Dysphagia        HPI:   Gina Cobb is a 64 y.o. female referred by Dr. Derinda Late, MD  for consultation & management of dysphagia.  Patient reports postnasal drip, bad taste in her mouth as well as sensation of food getting stuck as well as upper abdominal discomfort, unintentional weight loss.  Her symptoms started in early 2021.  She was originally evaluated by ENT, Dr. Pryor Ochoa.  Reportedly, laryngoscopy was unremarkable.  She is scheduled to undergo CT soft tissue neck with contrast on 7/15 to evaluate for any ear infection.  Patient reports losing about 8 pounds in the last few months from 118lbs to 108lbs.  She denies loss of appetite.  She also reports that she noticed that she felt something in her upper abdomen with a week ago.  She does report altered bowel habits for several years and was treated as irritable bowel syndrome.  She reports undergoing colonoscopy by Dr. Vira Agar in 2018, reportedly normal.  She does not smoke or drink alcohol  NSAIDs: None  Antiplts/Anticoagulants/Anti thrombotics: None  GI Procedures: Colonoscopy by Dr. Vira Agar in 2018, report not available  Past Medical History:  Diagnosis Date  . Anxiety   . Chest pain   . Dyspnea    Apparently had an abnormal myoview in 2007 followed by [facility] in 11/07 with EF 65%, normal coronaries. Stress echo (12/11): Baseline echo with EF 12-45%, normal diastolic function, no pulmonary hypertension, no significant valvular abnormalities; stress ECG showed ST changes but stress echo images were normal, suspect that this was a negative study  . Hyperlipidemia   . Hypertension   . Hypothyroidism   . PUD  (peptic ulcer disease)    distant history    Past Surgical History:  Procedure Laterality Date  . BREAST EXCISIONAL BIOPSY Left 09/2012   [facility]  . CARDIAC CATHETERIZATION  2009   at [facility]- normal    Current Outpatient Medications:  .  ALPRAZolam (XANAX) 0.25 MG tablet, Take 0.25 mg by mouth 3 (three) times daily as needed.  , Disp: , Rfl:  .  aspirin 81 MG EC tablet, Take 81 mg by mouth daily.  , Disp: , Rfl:  .  atenolol (TENORMIN) 50 MG tablet, Take 25 mg by mouth daily.  , Disp: , Rfl:  .  dicyclomine (BENTYL) 20 MG tablet, Take 1 tablet (20 mg total) by mouth 3 (three) times daily as needed for spasms., Disp: 20 tablet, Rfl: 0 .  ondansetron (ZOFRAN) 4 MG tablet, Take 1 tablet (4 mg total) by mouth daily as needed for nausea or vomiting., Disp: 20 tablet, Rfl: 1 .  simvastatin (ZOCOR) 20 MG tablet, Take 20 mg by mouth at bedtime.  , Disp: , Rfl:  .  SYNTHROID 75 MCG tablet, Take 75 mcg by mouth daily., Disp: , Rfl:  .  SYNTHROID 88 MCG tablet, Take 88 mcg by mouth every morning., Disp: , Rfl:  .  triamcinolone ointment (KENALOG) 0.1 %, , Disp: , Rfl:    Family History  Problem Relation Age of Onset  . Heart attack Father 81  . Hypertension Mother   . Heart attack Brother 68  .  Breast cancer Sister 80  . Breast cancer Paternal Grandmother 70     Social History   Tobacco Use  . Smoking status: Never Smoker  . Smokeless tobacco: Never Used  Substance Use Topics  . Alcohol use: Yes    Comment: occasional beer  . Drug use: No    Allergies as of 11/07/2019  . (No Known Allergies)    Review of Systems:    All systems reviewed and negative except where noted in HPI.   Physical Exam:  BP (!) 153/73 (BP Location: Left Arm, Patient Position: Sitting, Cuff Size: Normal)   Pulse 89   Temp 98.2 F (36.8 C) (Oral)   Ht 5\' 2"  (1.575 m)   Wt 110 lb 2 oz (50 kg) Comment: with heavy shoes  BMI 20.14 kg/m  No LMP recorded. Patient is postmenopausal.  General:   Alert,   Well-developed, well-nourished, pleasant and cooperative in NAD Head:  Normocephalic and atraumatic. Eyes:  Sclera clear, no icterus.   Conjunctiva pink. Ears:  Normal auditory acuity. Nose:  No deformity, discharge, or lesions. Mouth:  No deformity or lesions,oropharynx pink & moist. Neck:  Supple; no masses or thyromegaly. Lungs:  Respirations even and unlabored.  Clear throughout to auscultation.   No wheezes, crackles, or rhonchi. No acute distress. Heart:  Regular rate and rhythm; no murmurs, clicks, rubs, or gallops. Abdomen:  Normal bowel sounds.  Soft, palpable, localized area in epigastric region, non-tender and non-distended without masses, hepatosplenomegaly or hernias noted.  No guarding or rebound tenderness.   Rectal: Not performed Msk:  Symmetrical without gross deformities. Good, equal movement & strength bilaterally. Pulses:  Normal pulses noted. Extremities:  No clubbing or edema.  No cyanosis. Neurologic:  Alert and oriented x3;  grossly normal neurologically. Skin:  Intact without significant lesions or rashes. No jaundice. Psych:  Alert and cooperative. Normal mood and affect.  Imaging Studies: Reviewed  Assessment and Plan:   Gina Cobb is a 64 y.o. Caucasian female with several months history of dysphagia, postnasal drip and halitosis, epigastric discomfort with unintentional weight loss  Dysphagia, epigastric pain Recommend EGD with esophageal and gastric biopsies Trial of over-the-counter omeprazole 20 mg twice daily  Epigastric pain with unintentional weight loss and palpable area in epigastrium Recommend CT abdomen and pelvis with contrast   Follow up in 2 months   Cephas Darby, MD

## 2019-11-07 NOTE — Patient Instructions (Addendum)
Your CT scan is on 07/15/2021[place] [facility]. Nothing to eat or drink 4 hours before procedure

## 2019-11-09 ENCOUNTER — Other Ambulatory Visit: Payer: Self-pay

## 2019-11-09 ENCOUNTER — Ambulatory Visit
Admission: RE | Admit: 2019-11-09 | Discharge: 2019-11-09 | Disposition: A | Discharge status: Home / Self Care | Payer: Medicare Other | Source: Ambulatory Visit | Attending: Otolaryngology | Admitting: Otolaryngology

## 2019-11-09 DIAGNOSIS — R221 Localized swelling, mass and lump, neck: Secondary | ICD-10-CM

## 2019-11-09 DIAGNOSIS — R634 Abnormal weight loss: Secondary | ICD-10-CM | POA: Insufficient documentation

## 2019-11-09 DIAGNOSIS — R1013 Epigastric pain: Secondary | ICD-10-CM | POA: Diagnosis present

## 2019-11-09 DIAGNOSIS — R1310 Dysphagia, unspecified: Secondary | ICD-10-CM | POA: Diagnosis present

## 2019-11-09 MED ORDER — IOHEXOL 300 MG/ML  SOLN
100.0000 mL | Freq: Once | INTRAMUSCULAR | Status: AC | PRN
  Administered 2019-11-09: 75 mL via INTRAVENOUS

## 2019-11-13 ENCOUNTER — Telehealth: Payer: Self-pay

## 2019-11-13 NOTE — Telephone Encounter (Signed)
Patient states she is concerned something bad is going on. She states she has a EGD scheduled for 12/04/2019. She states she has went from 160lbs to 107lbs since March. She has not been trying to lose weight. She states she has some burning in throat that comes and goes. Her stomach makes a lot of noises. She states she is always fatigue.

## 2019-11-13 NOTE — Telephone Encounter (Signed)
Can you check with Endo for urgent upper endoscopy and I can do her on Friday this week Dx: Dysphagia and weight loss  Thanks RV

## 2019-11-14 NOTE — Telephone Encounter (Signed)
Sounds good, thank you   RV

## 2019-11-14 NOTE — Telephone Encounter (Signed)
Called and Gina Cobb states we can put her on in the pm on Friday. Called patient she is agreeable to do procedure Friday pm. Informed patient she would go for COVID test on 11/15/2019. Called Trish and got her to move patient and informed Olivia Mackie  My staff message

## 2019-11-15 ENCOUNTER — Other Ambulatory Visit
Admission: RE | Admit: 2019-11-15 | Discharge: 2019-11-15 | Disposition: A | Discharge status: Home / Self Care | Payer: Medicare Other | Source: Ambulatory Visit | Attending: Gastroenterology | Admitting: Gastroenterology

## 2019-11-15 ENCOUNTER — Other Ambulatory Visit: Payer: Self-pay

## 2019-11-15 DIAGNOSIS — Z20822 Contact with and (suspected) exposure to covid-19: Secondary | ICD-10-CM | POA: Insufficient documentation

## 2019-11-15 DIAGNOSIS — Z01812 Encounter for preprocedural laboratory examination: Secondary | ICD-10-CM | POA: Diagnosis present

## 2019-11-15 LAB — SARS CORONAVIRUS 2 (TAT 6-24 HRS): SARS Coronavirus 2: NEGATIVE

## 2019-11-17 ENCOUNTER — Encounter: Payer: Self-pay | Admitting: Gastroenterology

## 2019-11-17 ENCOUNTER — Ambulatory Visit
Admission: RE | Admit: 2019-11-17 | Discharge: 2019-11-17 | Disposition: A | Discharge status: Home / Self Care | Payer: Medicare Other | Attending: Gastroenterology | Admitting: Gastroenterology

## 2019-11-17 ENCOUNTER — Ambulatory Visit: Payer: Medicare Other | Admitting: Anesthesiology

## 2019-11-17 ENCOUNTER — Other Ambulatory Visit: Payer: Self-pay

## 2019-11-17 ENCOUNTER — Encounter
Admission: RE | Disposition: A | Discharge status: Home / Self Care | Payer: Self-pay | Source: Home / Self Care | Attending: Gastroenterology

## 2019-11-17 DIAGNOSIS — R1310 Dysphagia, unspecified: Secondary | ICD-10-CM | POA: Insufficient documentation

## 2019-11-17 DIAGNOSIS — E785 Hyperlipidemia, unspecified: Secondary | ICD-10-CM | POA: Insufficient documentation

## 2019-11-17 DIAGNOSIS — R195 Other fecal abnormalities: Secondary | ICD-10-CM

## 2019-11-17 DIAGNOSIS — I10 Essential (primary) hypertension: Secondary | ICD-10-CM | POA: Diagnosis not present

## 2019-11-17 DIAGNOSIS — Z79899 Other long term (current) drug therapy: Secondary | ICD-10-CM | POA: Insufficient documentation

## 2019-11-17 DIAGNOSIS — Z803 Family history of malignant neoplasm of breast: Secondary | ICD-10-CM | POA: Insufficient documentation

## 2019-11-17 DIAGNOSIS — R634 Abnormal weight loss: Secondary | ICD-10-CM

## 2019-11-17 DIAGNOSIS — R0600 Dyspnea, unspecified: Secondary | ICD-10-CM | POA: Insufficient documentation

## 2019-11-17 DIAGNOSIS — E039 Hypothyroidism, unspecified: Secondary | ICD-10-CM | POA: Insufficient documentation

## 2019-11-17 DIAGNOSIS — K279 Peptic ulcer, site unspecified, unspecified as acute or chronic, without hemorrhage or perforation: Secondary | ICD-10-CM | POA: Diagnosis not present

## 2019-11-17 DIAGNOSIS — F419 Anxiety disorder, unspecified: Secondary | ICD-10-CM | POA: Insufficient documentation

## 2019-11-17 DIAGNOSIS — Z7982 Long term (current) use of aspirin: Secondary | ICD-10-CM | POA: Diagnosis not present

## 2019-11-17 DIAGNOSIS — Z681 Body mass index (BMI) 19 or less, adult: Secondary | ICD-10-CM | POA: Diagnosis not present

## 2019-11-17 DIAGNOSIS — R197 Diarrhea, unspecified: Secondary | ICD-10-CM | POA: Diagnosis not present

## 2019-11-17 DIAGNOSIS — R0602 Shortness of breath: Secondary | ICD-10-CM | POA: Insufficient documentation

## 2019-11-17 DIAGNOSIS — R1319 Other dysphagia: Secondary | ICD-10-CM

## 2019-11-17 DIAGNOSIS — K2950 Unspecified chronic gastritis without bleeding: Secondary | ICD-10-CM | POA: Diagnosis not present

## 2019-11-17 DIAGNOSIS — K449 Diaphragmatic hernia without obstruction or gangrene: Secondary | ICD-10-CM | POA: Diagnosis not present

## 2019-11-17 DIAGNOSIS — L83 Acanthosis nigricans: Secondary | ICD-10-CM | POA: Insufficient documentation

## 2019-11-17 DIAGNOSIS — Z8249 Family history of ischemic heart disease and other diseases of the circulatory system: Secondary | ICD-10-CM | POA: Insufficient documentation

## 2019-11-17 HISTORY — PX: ESOPHAGOGASTRODUODENOSCOPY (EGD) WITH PROPOFOL: SHX5813

## 2019-11-17 SURGERY — ESOPHAGOGASTRODUODENOSCOPY (EGD) WITH PROPOFOL
Anesthesia: General

## 2019-11-17 MED ORDER — PROPOFOL 500 MG/50ML IV EMUL
INTRAVENOUS | Status: AC
  Filled 2019-11-17: qty 50

## 2019-11-17 MED ORDER — LIDOCAINE HCL (CARDIAC) PF 100 MG/5ML IV SOSY
PREFILLED_SYRINGE | INTRAVENOUS | Status: DC | PRN
  Administered 2019-11-17: 30 mg via INTRAVENOUS

## 2019-11-17 MED ORDER — PROPOFOL 10 MG/ML IV BOLUS
INTRAVENOUS | Status: DC | PRN
  Administered 2019-11-17: 25 mg via INTRAVENOUS
  Administered 2019-11-17: 75 mg via INTRAVENOUS

## 2019-11-17 MED ORDER — LIDOCAINE HCL (PF) 2 % IJ SOLN
INTRAMUSCULAR | Status: AC
  Filled 2019-11-17: qty 5

## 2019-11-17 MED ORDER — PROPOFOL 500 MG/50ML IV EMUL
INTRAVENOUS | Status: DC | PRN
  Administered 2019-11-17: 125 ug/kg/min via INTRAVENOUS

## 2019-11-17 MED ORDER — SODIUM CHLORIDE 0.9 % IV SOLN: INTRAVENOUS | Status: DC

## 2019-11-17 NOTE — Anesthesia Preprocedure Evaluation (Signed)
Anesthesia Evaluation  Patient identified by MRN, date of birth, ID band Patient awake    Reviewed: Allergy & Precautions, H&P , NPO status , Patient's Chart, lab work & pertinent test results, reviewed documented beta blocker date and time   History of Anesthesia Complications Negative for: history of anesthetic complications  Airway Mallampati: I  TM Distance: >3 FB Neck ROM: full    Dental  (+) Dental Advidsory Given, Caps, Teeth Intact, Missing   Pulmonary shortness of breath and with exertion, neg sleep apnea, neg COPD, neg recent URI,    Pulmonary exam normal breath sounds clear to auscultation       Cardiovascular Exercise Tolerance: Good hypertension, (-) angina(-) Past MI and (-) Cardiac Stents Normal cardiovascular exam(-) dysrhythmias (-) Valvular Problems/Murmurs Rhythm:regular Rate:Normal     Neuro/Psych PSYCHIATRIC DISORDERS Anxiety negative neurological ROS     GI/Hepatic Neg liver ROS, PUD, GERD  ,  Endo/Other  neg diabetesHypothyroidism   Renal/GU negative Renal ROS  negative genitourinary   Musculoskeletal   Abdominal   Peds  Hematology negative hematology ROS (+)   Anesthesia Other Findings Past Medical History: No date: Anxiety No date: Chest pain No date: Dyspnea     Comment:  Apparently had an abnormal myoview in 2007 followed by               [place] in 11/07 with EF 65%, normal coronaries. Stress echo               (12/11): Baseline echo with EF 37-48%, normal diastolic               function, no pulmonary hypertension, no significant               valvular abnormalities; stress ECG showed ST changes but               stress echo images were normal, suspect that this was a               negative study No date: Hyperlipidemia No date: Hypertension No date: Hypothyroidism No date: PUD (peptic ulcer disease)     Comment:  distant history   Reproductive/Obstetrics negative OB ROS                              Anesthesia Physical Anesthesia Plan  ASA: II  Anesthesia Plan: General   Post-op Pain Management:    Induction: Intravenous  PONV Risk Score and Plan: 3 and Propofol infusion and TIVA  Airway Management Planned: Natural Airway and Nasal Cannula  Additional Equipment:   Intra-op Plan:   Post-operative Plan:   Informed Consent: I have reviewed the patients History and Physical, chart, labs and discussed the procedure including the risks, benefits and alternatives for the proposed anesthesia with the patient or authorized representative who has indicated his/her understanding and acceptance.     Dental Advisory Given  Plan Discussed with: Anesthesiologist, CRNA and Surgeon  Anesthesia Plan Comments:         Anesthesia Quick Evaluation

## 2019-11-17 NOTE — Anesthesia Postprocedure Evaluation (Signed)
Anesthesia Post Note  Patient: Gina Cobb  Procedure(s) Performed: ESOPHAGOGASTRODUODENOSCOPY (EGD) WITH PROPOFOL (N/A )  Patient location during evaluation: Endoscopy Anesthesia Type: General Level of consciousness: awake and alert Pain management: pain level controlled Vital Signs Assessment: post-procedure vital signs reviewed and stable Respiratory status: spontaneous breathing, nonlabored ventilation, respiratory function stable and patient connected to nasal cannula oxygen Cardiovascular status: blood pressure returned to baseline and stable Postop Assessment: no apparent nausea or vomiting Anesthetic complications: no   No complications documented.   Last Vitals:  Vitals:   11/17/19 1314 11/17/19 1324  BP: (!) 146/74 (!) 152/75  Pulse: 55 58  Resp: (!) 25 13  Temp:    SpO2: 100% 100%    Last Pain:  Vitals:   11/17/19 1324  TempSrc:   PainSc: 0-No pain                 Martha Clan

## 2019-11-17 NOTE — Op Note (Signed)
[facility] Patient Name: Gina Cobb Procedure Date: 11/17/2019 12:22 PM MRN: [id number] Account #: [id number] Date of Birth: [birth year] Admit Type: Inpatient Age: 64 Room: [facility] ENDO ROOM 2 Gender: Female Note Status: Finalized Procedure:             Upper GI endoscopy Indications:           Dysphagia, Diarrhea, Weight loss Providers:             Lin Landsman MD, MD Medicines:             Monitored Anesthesia Care Complications:         No immediate complications. Estimated blood loss: None. Procedure:             Pre-Anesthesia Assessment:                        - Prior to the procedure, a History and Physical was                         performed, and patient medications and allergies were                         reviewed. The patient is competent. The risks and                         benefits of the procedure and the sedation options and                         risks were discussed with the patient. All questions                         were answered and informed consent was obtained.                         Patient identification and proposed procedure were                         verified by the physician, the nurse, the                         anesthesiologist, the anesthetist and the technician                         in the pre-procedure area in the procedure room in the                         endoscopy suite. Mental Status Examination: alert and                         oriented. Airway Examination: normal oropharyngeal                         airway and neck mobility. Respiratory Examination:                         clear to auscultation. CV Examination: normal.                         Prophylactic Antibiotics: The patient does  not require                         prophylactic antibiotics. Prior Anticoagulants: The                         patient has taken no previous anticoagulant or                         antiplatelet agents. ASA  Grade Assessment: II - A                         patient with mild systemic disease. After reviewing                         the risks and benefits, the patient was deemed in                         satisfactory condition to undergo the procedure. The                         anesthesia plan was to use monitored anesthesia care                         (MAC). Immediately prior to administration of                         medications, the patient was re-assessed for adequacy                         to receive sedatives. The heart rate, respiratory                         rate, oxygen saturations, blood pressure, adequacy of                         pulmonary ventilation, and response to care were                         monitored throughout the procedure. The physical                         status of the patient was re-assessed after the                         procedure.                        After obtaining informed consent, the endoscope was                         passed under direct vision. Throughout the procedure,                         the patient's blood pressure, pulse, and oxygen                         saturations were monitored continuously. The Endoscope  was introduced through the mouth, and advanced to the                         second part of duodenum. The upper GI endoscopy was                         accomplished without difficulty. The patient tolerated                         the procedure well. Findings:      The examined duodenum was normal. Biopsies for histology were taken with       a cold forceps for evaluation of celiac disease.      A small hiatal hernia was present.      The entire examined stomach was normal. Biopsies were taken with a cold       forceps for Helicobacter pylori testing.      The cardia and gastric fundus were normal on retroflexion.      Esophagogastric landmarks were identified: the gastroesophageal junction        was found at 36 cm from the incisors.      The examined esophagus was normal. Given h/o dysphagia, decision was       made to emperically dilate, A TTS dilator was passed through the scope.       Dilation with a 12-13.5-15 mm balloon and a 15-16.5-18 mm balloon       dilator was performed to 18 mm. Biopsies were taken with a cold forceps       for histology. Impression:            - Normal examined duodenum. Biopsied.                        - Small hiatal hernia.                        - Normal stomach. Biopsied.                        - Esophagogastric landmarks identified.                        - Normal esophagus. Dilated. Biopsied. Recommendation:        - Await pathology results.                        - Discharge patient to home (with escort).                        - Resume previous diet today.                        - Continue present medications.                        - Return to my office as previously scheduled. Procedure Code(s):     --- Professional ---                        [phone], Esophagogastroduodenoscopy, flexible,  transoral; with transendoscopic balloon dilation of                         esophagus (less than 30 mm diameter)                        43239, 59, Esophagogastroduodenoscopy, flexible,                         transoral; with biopsy, single or multiple Diagnosis Code(s):     --- Professional ---                        K44.9, Diaphragmatic hernia without obstruction or                         gangrene                        R13.10, Dysphagia, unspecified                        R19.7, Diarrhea, unspecified                        R63.4, Abnormal weight loss CPT copyright 2019 American Medical Association. All rights reserved. The codes documented in this report are preliminary and upon coder review may  be revised to meet current compliance requirements. Dr. Ulyess Mort Lin Landsman MD, MD 11/17/2019 12:54:01 PM This report has  been signed electronically. Number of Addenda: 0 Note Initiated On: 11/17/2019 12:22 PM Estimated Blood Loss:  Estimated blood loss: none.      [facility]

## 2019-11-17 NOTE — Transfer of Care (Signed)
Immediate Anesthesia Transfer of Care Note  Patient: Gina Cobb  Procedure(s) Performed: ESOPHAGOGASTRODUODENOSCOPY (EGD) WITH PROPOFOL (N/A )  Patient Location: PACU and Endoscopy Unit  Anesthesia Type:General  Level of Consciousness: sedated  Airway & Oxygen Therapy: Patient Spontanous Breathing  Post-op Assessment: Report given to RN  Post vital signs: stable  Last Vitals:  Vitals Value Taken Time  BP 128/71 11/17/19 1254  Temp    Pulse 67 11/17/19 1255  Resp 16 11/17/19 1255  SpO2 99 % 11/17/19 1255  Vitals shown include unvalidated device data.  Last Pain:  Vitals:   11/17/19 1254  TempSrc: (P) Temporal  PainSc:          Complications: No complications documented.

## 2019-11-17 NOTE — H&P (Signed)
Gina Darby, MD [address]  [place]  [place], [place] [address]  Main: [phone]  Fax: [phone] Pager: [phone]  Primary Care Physician:  Derinda Late, MD Primary Gastroenterologist:  Dr. Cephas Cobb  Pre-Procedure History & Physical: HPI:  Gina Cobb is a 64 y.o. female is here for an endoscopy.   Past Medical History:  Diagnosis Date  . Anxiety   . Chest pain   . Dyspnea    Apparently had an abnormal myoview in 2007 followed by [place] in 11/07 with EF 65%, normal coronaries. Stress echo (12/11): Baseline echo with EF 31-49%, normal diastolic function, no pulmonary hypertension, no significant valvular abnormalities; stress ECG showed ST changes but stress echo images were normal, suspect that this was a negative study  . Hyperlipidemia   . Hypertension   . Hypothyroidism   . PUD (peptic ulcer disease)    distant history    Past Surgical History:  Procedure Laterality Date  . BREAST EXCISIONAL BIOPSY Left 09/2012   [facility]  . CARDIAC CATHETERIZATION  2009   at [facility]- normal    Prior to Admission medications   Medication Sig Start Date End Date Taking? Authorizing Provider  aspirin 81 MG EC tablet Take 81 mg by mouth daily.     Yes [provider]  atenolol (TENORMIN) 50 MG tablet Take 25 mg by mouth daily.     Yes [provider]  simvastatin (ZOCOR) 20 MG tablet Take 20 mg by mouth at bedtime.     Yes [provider]  SYNTHROID 75 MCG tablet Take 75 mcg by mouth daily. 11/06/19  Yes [provider]  triamcinolone ointment (KENALOG) 0.1 %  07/11/18  Yes [provider]  ALPRAZolam (XANAX) 0.25 MG tablet Take 0.25 mg by mouth 3 (three) times daily as needed.   Patient not taking: Reported on 11/17/2019    [provider]  dicyclomine (BENTYL) 20 MG tablet Take 1 tablet (20 mg total) by mouth 3 (three) times daily as needed for spasms. 11/20/14   Earleen Newport, MD  ondansetron (ZOFRAN) 4 MG  tablet Take 1 tablet (4 mg total) by mouth daily as needed for nausea or vomiting. 11/20/14   Earleen Newport, MD  SYNTHROID 88 MCG tablet Take 88 mcg by mouth every morning. Patient not taking: Reported on 11/17/2019 10/28/19   [provider]    Allergies as of 11/07/2019  . (No Known Allergies)    Family History  Problem Relation Age of Onset  . Heart attack Father 66  . Hypertension Mother   . Heart attack Brother 52  . Breast cancer Sister 10  . Breast cancer Paternal Grandmother 68    Social History   Socioeconomic History  . Marital status: Married    Spouse name: Not on file  . Number of children: 2  . Years of education: Not on file  . Highest education level: Not on file  Occupational History  . Occupation: Retail    Comment: Part Time  Tobacco Use  . Smoking status: Never Smoker  . Smokeless tobacco: Never Used  Substance and Sexual Activity  . Alcohol use: Yes    Comment: occasional beer  . Drug use: No  . Sexual activity: Not on file  Other Topics Concern  . Not on file  Social History Narrative   Lives in [place]. Regular exercise - walks 3x weekly   Social Determinants of Health   Financial Resource Strain:   .  Difficulty of Paying Living Expenses:   Food Insecurity:   . Worried About Charity fundraiser in the Last Year:   . Arboriculturist in the Last Year:   Transportation Needs:   . Film/video editor (Medical):   Marland Kitchen Lack of Transportation (Non-Medical):   Physical Activity:   . Days of Exercise per Week:   . Minutes of Exercise per Session:   Stress:   . Feeling of Stress :   Social Connections:   . Frequency of Communication with Friends and Family:   . Frequency of Social Gatherings with Friends and Family:   . Attends Religious Services:   . Active Member of Clubs or Organizations:   . Attends Archivist Meetings:   Marland Kitchen Marital Status:   Intimate Partner Violence:   . Fear of Current or Ex-Partner:   .  Emotionally Abused:   Marland Kitchen Physically Abused:   . Sexually Abused:     Review of Systems: See HPI, otherwise negative ROS  Physical Exam: BP (!) 179/72   Pulse 66   Temp (!) 97 F (36.1 C) (Temporal)   Resp 16   Ht 5\' 2"  (1.575 m)   Wt 48.5 kg   SpO2 100%   BMI 19.57 kg/m  General:   Alert,  pleasant and cooperative in NAD Head:  Normocephalic and atraumatic. Neck:  Supple; no masses or thyromegaly. Lungs:  Clear throughout to auscultation.    Heart:  Regular rate and rhythm. Abdomen:  Soft, nontender and nondistended. Normal bowel sounds, without guarding, and without rebound.   Neurologic:  Alert and  oriented x4;  grossly normal neurologically.  Impression/Plan: [name] is here for an endoscopy to be performed for dysphagia, weight loss, epigastric pain  Risks, benefits, limitations, and alternatives regarding  endoscopy have been reviewed with the patient.  Questions have been answered.  All parties agreeable.   Sherri Sear, MD  11/17/2019, 11:48 AM

## 2019-11-20 ENCOUNTER — Encounter: Payer: Self-pay | Admitting: Gastroenterology

## 2019-11-21 LAB — SURGICAL PATHOLOGY

## 2020-02-01 ENCOUNTER — Ambulatory Visit (INDEPENDENT_AMBULATORY_CARE_PROVIDER_SITE_OTHER): Payer: Medicare Other | Admitting: Dermatology

## 2020-02-01 ENCOUNTER — Other Ambulatory Visit: Payer: Self-pay

## 2020-02-01 DIAGNOSIS — Z1283 Encounter for screening for malignant neoplasm of skin: Secondary | ICD-10-CM

## 2020-02-01 DIAGNOSIS — L814 Other melanin hyperpigmentation: Secondary | ICD-10-CM | POA: Diagnosis not present

## 2020-02-01 DIAGNOSIS — D1800 Hemangioma unspecified site: Secondary | ICD-10-CM

## 2020-02-01 DIAGNOSIS — L570 Actinic keratosis: Secondary | ICD-10-CM | POA: Diagnosis not present

## 2020-02-01 DIAGNOSIS — D485 Neoplasm of uncertain behavior of skin: Secondary | ICD-10-CM

## 2020-02-01 DIAGNOSIS — L578 Other skin changes due to chronic exposure to nonionizing radiation: Secondary | ICD-10-CM

## 2020-02-01 DIAGNOSIS — B078 Other viral warts: Secondary | ICD-10-CM

## 2020-02-01 DIAGNOSIS — R202 Paresthesia of skin: Secondary | ICD-10-CM

## 2020-02-01 DIAGNOSIS — D229 Melanocytic nevi, unspecified: Secondary | ICD-10-CM

## 2020-02-01 DIAGNOSIS — L821 Other seborrheic keratosis: Secondary | ICD-10-CM | POA: Diagnosis not present

## 2020-02-01 DIAGNOSIS — D225 Melanocytic nevi of trunk: Secondary | ICD-10-CM

## 2020-02-01 NOTE — Patient Instructions (Addendum)
Recommend Gold Bond Rapid Relief Anti-Itch cream up to 3 times per day to areas that are itchy.  May also use Capsaicin 5-6 times daily for about 3 weeks then slowly decrease.   Melanoma ABCDEs  Melanoma is the most dangerous type of skin cancer, and is the leading cause of death from skin disease.  You are more likely to develop melanoma if you:  Have light-colored skin, light-colored eyes, or red or blond hair  Spend a lot of time in the sun  Tan regularly, either outdoors or in a tanning bed  Have had blistering sunburns, especially during childhood  Have a close family member who has had a melanoma  Have atypical moles or large birthmarks  Early detection of melanoma is key since treatment is typically straightforward and cure rates are extremely high if we catch it early.   The first sign of melanoma is often a change in a mole or a new dark spot.  The ABCDE system is a way of remembering the signs of melanoma.  A for asymmetry:  The two halves do not match. B for border:  The edges of the growth are irregular. C for color:  A mixture of colors are present instead of an even brown color. D for diameter:  Melanomas are usually (but not always) greater than 73mm - the size of a pencil eraser. E for evolution:  The spot keeps changing in size, shape, and color.  Please check your skin once per month between visits. You can use a small mirror in front and a large mirror behind you to keep an eye on the back side or your body.   If you see any new or changing lesions before your next follow-up, please call to schedule a visit.  Please continue daily skin protection including broad spectrum sunscreen SPF 30+ to sun-exposed areas, reapplying every 2 hours as needed when you're outdoors.   Wound Care Instructions  1. Cleanse wound gently with soap and water once a day then pat dry with clean gauze. Apply a thing coat of Petrolatum (petroleum jelly, "Vaseline") over the wound (unless  you have an allergy to this). We recommend that you use a new, sterile tube of Vaseline. Do not pick or remove scabs. Do not remove the yellow or white "healing tissue" from the base of the wound.  2. Cover the wound with fresh, clean, nonstick gauze and secure with paper tape. You may use Band-Aids in place of gauze and tape if the would is small enough, but would recommend trimming much of the tape off as there is often too much. Sometimes Band-Aids can irritate the skin.  3. You should call the office for your biopsy report after 1 week if you have not already been contacted.  4. If you experience any problems, such as abnormal amounts of bleeding, swelling, significant bruising, significant pain, or evidence of infection, please call the office immediately.  5. FOR ADULT SURGERY PATIENTS: If you need something for pain relief you may take 1 extra strength Tylenol (acetaminophen) AND 2 Ibuprofen (200mg  each) together every 4 hours as needed for pain. (do not take these if you are allergic to them or if you have a reason you should not take them.) Typically, you may only need pain medication for 1 to 3 days.   Instructions for After In-Office Application of Cantharidin  1. This is a strong medicine; please follow ALL instructions.  2. Gently wash off with soap and water in  four hours or sooner s directed by your physician.  3. **WARNING** this medicine can cause severe blistering, blood blisters, infection, and/or scarring if it is not washed off as directed.  4. Your progress will be rechecked in 1-2 months; call sooner if there are any questions or problems.  Cryotherapy Aftercare  . Wash gently with soap and water everyday.   Marland Kitchen Apply Vaseline and Band-Aid daily until healed.  Prior to procedure, discussed risks of blister formation, small wound, skin dyspigmentation, or rare scar following cryotherapy.

## 2020-02-01 NOTE — Progress Notes (Signed)
Follow-Up Visit   Subjective  Gina Cobb is a 64 y.o. female who presents for the following: FBSE.  Patient here for full body skin exam and skin cancer screening. She does have some spots at her back that are itching. No h/o skin cancer.   The following portions of the chart were reviewed this encounter and updated as appropriate:  Tobacco  Allergies  Meds  Problems  Med Hx  Surg Hx  Fam Hx      Review of Systems:  No other skin or systemic complaints except as noted in HPI or Assessment and Plan.  Objective  Well appearing patient in no apparent distress; mood and affect are within normal limits.  A full examination was performed including scalp, head, eyes, ears, nose, lips, neck, chest, axillae, abdomen, back, buttocks, bilateral upper extremities, bilateral lower extremities, hands, feet, fingers, toes, fingernails, and toenails. All findings within normal limits unless otherwise noted below.  Objective  Right Upper Back: Mild hyperpigmentation  Objective  Right clavicle: 0.2cm pink and brown papule         Objective  Right Plantar Foot, left 3rd toe: Verrucous papules  Objective  left zygoma: Erythematous thin papules/macules with gritty scale.    Assessment & Plan  Notalgia paresthetica Right Upper Back  Recommend Gold Bond Rapid Relief Anti-Itch cream up to 3 times per day to areas that are itchy.  May also use Capsaicin 5-6 times daily for about 3 weeks then slowly decrease.  Neoplasm of uncertain behavior of skin Right clavicle  Epidermal / dermal shaving  Lesion diameter (cm):  0.2 Informed consent: discussed and consent obtained   Timeout: patient name, date of birth, surgical site, and procedure verified   Patient was prepped and draped in usual sterile fashion: area prepped with isopropyl alcohol. Anesthesia: the lesion was anesthetized in a standard fashion   Anesthetic:  1% lidocaine w/ epinephrine 1-100,000 buffered w/ 8.4%  NaHCO3 Instrument used: flexible razor blade   Hemostasis achieved with: aluminum chloride   Outcome: patient tolerated procedure well   Post-procedure details: wound care instructions given   Additional details:  Mupirocin and a bandage applied  Specimen 1 - Surgical pathology Differential Diagnosis: R/o pigmented BCC vs nevus vs other Check Margins: No 0.2cm pink and brown papule    Other viral warts Right Plantar Foot, left 3rd toe  Discussed viral etiology and risk of spread.  Discussed multiple treatments may be required to clear warts.  Discussed possible post-treatment dyspigmentation and risk of recurrence.  Prior to procedure, discussed risks of blister formation, small wound, skin dyspigmentation, or rare scar following cryotherapy.    Destruction of lesion - Right Plantar Foot, left 3rd toe Complexity: simple   Destruction method: cryotherapy   Informed consent: discussed and consent obtained   Timeout:  patient name, date of birth, surgical site, and procedure verified Outcome: patient tolerated procedure well with no complications   Post-procedure details: wound care instructions given    AK (actinic keratosis) left zygoma  Prior to procedure, discussed risks of blister formation, small wound, skin dyspigmentation, or rare scar following cryotherapy.    Destruction of lesion - left zygoma  Destruction method: cryotherapy   Informed consent: discussed and consent obtained   Lesion destroyed using liquid nitrogen: Yes   Cryotherapy cycles:  2 Outcome: patient tolerated procedure well with no complications   Post-procedure details: wound care instructions given    Lentigines - Scattered tan macules - Discussed due to sun exposure -  Benign, observe - Call for any changes  Seborrheic Keratoses - Stuck-on, waxy, tan-brown papules and plaques  - Discussed benign etiology and prognosis. - Observe - Call for any changes  Melanocytic Nevi - Tan-brown and/or  pink-flesh-colored symmetric macules and papules - Benign appearing on exam today - Observation - Call clinic for new or changing moles - Recommend daily use of broad spectrum spf 30+ sunscreen to sun-exposed areas.   Hemangiomas - Red papules - Discussed benign nature - Observe - Call for any changes  Actinic Damage - diffuse scaly erythematous macules with underlying dyspigmentation - Recommend daily broad spectrum sunscreen SPF 30+ to sun-exposed areas, reapply every 2 hours as needed.  - Call for new or changing lesions.  Skin cancer screening performed today.   Return for AK follow up, warts 1-2 months.  Graciella Belton, RMA, am acting as scribe for Forest Gleason, MD .  Documentation: I have reviewed the above documentation for accuracy and completeness, and I agree with the above.  Forest Gleason, MD

## 2020-02-06 NOTE — Progress Notes (Signed)
Skin , right clavicle MELANOCYTIC NEVUS, INTRADERMAL TYPE, BASE INVOLVED  This is a NORMAL MOLE. No additional treatment is needed. If you notice any new or changing spots or have other skin concerns in future, please call our office at [phone].

## 2020-02-20 ENCOUNTER — Encounter: Payer: Self-pay | Admitting: Dermatology

## 2020-03-19 ENCOUNTER — Ambulatory Visit (INDEPENDENT_AMBULATORY_CARE_PROVIDER_SITE_OTHER): Payer: Medicare Other | Admitting: Dermatology

## 2020-03-19 ENCOUNTER — Other Ambulatory Visit: Payer: Self-pay

## 2020-03-19 DIAGNOSIS — L853 Xerosis cutis: Secondary | ICD-10-CM

## 2020-03-19 DIAGNOSIS — L578 Other skin changes due to chronic exposure to nonionizing radiation: Secondary | ICD-10-CM | POA: Diagnosis not present

## 2020-03-19 DIAGNOSIS — Z872 Personal history of diseases of the skin and subcutaneous tissue: Secondary | ICD-10-CM

## 2020-03-19 DIAGNOSIS — B078 Other viral warts: Secondary | ICD-10-CM | POA: Diagnosis not present

## 2020-03-19 NOTE — Progress Notes (Signed)
   Follow-Up Visit   Subjective  Gina Cobb is a 64 y.o. female who presents for the following: Actinic Keratosis (1 month f/u on Ak on the L zygoma treated with LN2) and Warts (1 month f/u wart L 3rd toe treated with LN2 ). She believes wart may be gone. She does not some difficulty with dry skin.   The following portions of the chart were reviewed this encounter and updated as appropriate:  Tobacco  Allergies  Meds  Problems  Med Hx  Surg Hx  Fam Hx      Review of Systems:  No other skin or systemic complaints except as noted in HPI or Assessment and Plan.  Objective  Well appearing patient in no apparent distress; mood and affect are within normal limits.   A focused examination was performed including face, Left foot . Relevant physical exam findings are noted in the Assessment and Plan.  Objective  Left Foot - Anterior: Clear skin   Objective  L zygoma: Clear skin    Assessment & Plan  Other viral warts Left Foot - Anterior  Clear. Observe for recurrence. Call clinic for new or changing lesions.    Hx of actinic keratosis L zygoma  Clear. Observe for recurrence. Call clinic for new or changing lesions.  Recommend regular skin exams, daily broad-spectrum spf 30+ sunscreen use, and photoprotection.    Actinic Damage - chronic, secondary to cumulative UV radiation exposure/sun exposure over time - diffuse scaly erythematous macules with underlying dyspigmentation - Recommend daily broad spectrum sunscreen SPF 30+ to sun-exposed areas, reapply every 2 hours as needed.  - Call for new or changing lesions.  Xerosis - Recommend gentle skin care (handout provided) - Call for development of stubborn itch  Return in about 1 year (around 03/19/2021).  I, Marye Round, CMA, am acting as scribe for Forest Gleason, MD .  Documentation: I have reviewed the above documentation for accuracy and completeness, and I agree with the above.  Forest Gleason, MD

## 2020-03-19 NOTE — Patient Instructions (Signed)
Gentle Skin Care Guide  1. Bathe no more than once a day.  2. Avoid bathing in hot water; Use Lukewarm water  3. Use a mild soap like Dove, Vanicream, Cetaphil, CeraVe. Can use Lever 2000 or Cetaphil antibacterial soap  4. Use soap only where you need it. On most days, use it under your arms, between your legs, and on your feet. Let the water rinse other areas unless visibly dirty.  5. When you get out of the bath/shower, use a towel to gently blot your skin dry, don't rub it.  6. While your skin is still a little damp, apply a moisturizing cream such as Vanicream, CeraVe, Cetaphil, Eucerin, Sarna lotion or plain Vaseline Jelly. For hands apply Neutrogena [place] Hand Cream or Excipial Hand Cream.  7. Reapply moisturizer any time you start to itch or feel dry.  8. Sometimes using free and clear laundry detergents can be helpful. Fabric softener sheets should be avoided. Downy Free & Gentle liquid, or any liquid fabric softener that is free of dyes and perfumes, it acceptable to use  9. If your doctor has given you prescription creams you may apply moisturizers over them

## 2020-03-25 ENCOUNTER — Encounter: Payer: Self-pay | Admitting: Dermatology

## 2020-07-16 ENCOUNTER — Other Ambulatory Visit: Payer: Self-pay | Admitting: Obstetrics and Gynecology

## 2020-07-16 DIAGNOSIS — R5381 Other malaise: Secondary | ICD-10-CM

## 2020-07-16 DIAGNOSIS — E2839 Other primary ovarian failure: Secondary | ICD-10-CM

## 2021-01-07 ENCOUNTER — Other Ambulatory Visit: Payer: Medicare Other

## 2021-09-01 ENCOUNTER — Other Ambulatory Visit: Payer: Self-pay

## 2021-09-01 ENCOUNTER — Emergency Department: Payer: Medicare Other

## 2021-09-01 ENCOUNTER — Emergency Department
Admission: EM | Admit: 2021-09-01 | Discharge: 2021-09-01 | Disposition: A | Discharge status: Home / Self Care | Payer: Medicare Other | Attending: Emergency Medicine | Admitting: Emergency Medicine

## 2021-09-01 DIAGNOSIS — R109 Unspecified abdominal pain: Secondary | ICD-10-CM | POA: Insufficient documentation

## 2021-09-01 DIAGNOSIS — M545 Low back pain, unspecified: Secondary | ICD-10-CM | POA: Insufficient documentation

## 2021-09-01 DIAGNOSIS — R0789 Other chest pain: Secondary | ICD-10-CM | POA: Diagnosis present

## 2021-09-01 DIAGNOSIS — R0602 Shortness of breath: Secondary | ICD-10-CM | POA: Diagnosis not present

## 2021-09-01 DIAGNOSIS — N23 Unspecified renal colic: Secondary | ICD-10-CM

## 2021-09-01 DIAGNOSIS — N200 Calculus of kidney: Secondary | ICD-10-CM

## 2021-09-01 DIAGNOSIS — I10 Essential (primary) hypertension: Secondary | ICD-10-CM | POA: Insufficient documentation

## 2021-09-01 LAB — URINALYSIS, ROUTINE W REFLEX MICROSCOPIC
Bacteria, UA: NONE SEEN
Bilirubin Urine: NEGATIVE
Glucose, UA: NEGATIVE mg/dL
Hgb urine dipstick: NEGATIVE
Ketones, ur: 20 mg/dL — AB
Nitrite: NEGATIVE
Protein, ur: NEGATIVE mg/dL
Specific Gravity, Urine: >1.046 — ABNORMAL HIGH (ref 1.005–1.030)
Squamous Epithelial / HPF: NONE SEEN (ref 0–5)
WBC, UA: >50 WBC/hpf — ABNORMAL HIGH (ref 0–5)
pH: 7 (ref 5.0–8.0)

## 2021-09-01 LAB — CBC
HCT: 38.2 % (ref 36.0–46.0)
Hemoglobin: 12.7 g/dL (ref 12.0–15.0)
MCH: 28.9 pg (ref 26.0–34.0)
MCHC: 33.2 g/dL (ref 30.0–36.0)
MCV: 86.8 fL (ref 80.0–100.0)
Platelets: 267 10*3/uL (ref 150–400)
RBC: 4.4 MIL/uL (ref 3.87–5.11)
RDW: 11.6 % (ref 11.5–15.5)
WBC: 8.4 10*3/uL (ref 4.0–10.5)
nRBC: 0 % (ref 0.0–0.2)

## 2021-09-01 LAB — BASIC METABOLIC PANEL
Anion gap: 13 (ref 5–15)
BUN: 15 mg/dL (ref 8–23)
CO2: 22 mmol/L (ref 22–32)
Calcium: 9.9 mg/dL (ref 8.9–10.3)
Chloride: 104 mmol/L (ref 98–111)
Creatinine, Ser: 1.01 mg/dL — ABNORMAL HIGH (ref 0.44–1.00)
GFR, Estimated: >60 mL/min (ref >60)
Glucose, Bld: 131 mg/dL — ABNORMAL HIGH (ref 70–99)
Potassium: 3.5 mmol/L (ref 3.5–5.1)
Sodium: 139 mmol/L (ref 135–145)

## 2021-09-01 LAB — TROPONIN I (HIGH SENSITIVITY): Troponin I (High Sensitivity): 6 ng/L (ref <18)

## 2021-09-01 MED ORDER — TAMSULOSIN HCL 0.4 MG PO CAPS: 0.4000 mg | ORAL_CAPSULE | Freq: Every day | ORAL | 0 refills | Status: AC

## 2021-09-01 MED ORDER — OXYCODONE-ACETAMINOPHEN 5-325 MG PO TABS: 1.0000 | ORAL_TABLET | Freq: Four times a day (QID) | ORAL | 0 refills | Status: DC | PRN

## 2021-09-01 MED ORDER — SODIUM CHLORIDE 0.9 % IV BOLUS
1000.0000 mL | Freq: Once | INTRAVENOUS | Status: AC
  Administered 2021-09-01: 1000 mL via INTRAVENOUS

## 2021-09-01 MED ORDER — CEFDINIR 300 MG PO CAPS
300.0000 mg | ORAL_CAPSULE | Freq: Two times a day (BID) | ORAL | 0 refills | Status: AC
Start: 2021-09-01 — End: 2021-09-08

## 2021-09-01 MED ORDER — KETOROLAC TROMETHAMINE 30 MG/ML IJ SOLN
15.0000 mg | Freq: Once | INTRAMUSCULAR | Status: AC
  Administered 2021-09-01: 15 mg via INTRAVENOUS
  Filled 2021-09-01: qty 1

## 2021-09-01 MED ORDER — IOHEXOL 350 MG/ML SOLN
75.0000 mL | Freq: Once | INTRAVENOUS | Status: AC | PRN
  Administered 2021-09-01: 75 mL via INTRAVENOUS

## 2021-09-01 MED ORDER — OXYCODONE-ACETAMINOPHEN 5-325 MG PO TABS
1.0000 | ORAL_TABLET | Freq: Once | ORAL | Status: AC
  Administered 2021-09-01: 1 via ORAL
  Filled 2021-09-01: qty 1

## 2021-09-01 MED ORDER — ONDANSETRON 4 MG PO TBDP: 4.0000 mg | ORAL_TABLET | Freq: Three times a day (TID) | ORAL | 0 refills | Status: DC | PRN

## 2021-09-01 MED ORDER — ONDANSETRON HCL 4 MG/2ML IJ SOLN
4.0000 mg | Freq: Once | INTRAMUSCULAR | Status: AC
Start: 2021-09-01 — End: 2021-09-01
  Administered 2021-09-01: 4 mg via INTRAVENOUS
  Filled 2021-09-01: qty 2

## 2021-09-01 MED ORDER — ONDANSETRON 4 MG PO TBDP
4.0000 mg | ORAL_TABLET | Freq: Once | ORAL | Status: AC
  Administered 2021-09-01: 4 mg via ORAL
  Filled 2021-09-01: qty 1

## 2021-09-01 MED ORDER — MORPHINE SULFATE (PF) 4 MG/ML IV SOLN
4.0000 mg | Freq: Once | INTRAVENOUS | Status: AC
  Administered 2021-09-01: 4 mg via INTRAVENOUS
  Filled 2021-09-01: qty 1

## 2021-09-01 MED ORDER — TAMSULOSIN HCL 0.4 MG PO CAPS
0.4000 mg | ORAL_CAPSULE | Freq: Once | ORAL | Status: AC
  Administered 2021-09-01: 0.4 mg via ORAL
  Filled 2021-09-01: qty 1

## 2021-09-01 MED ORDER — SODIUM CHLORIDE 0.9 % IV SOLN
2.0000 g | Freq: Once | INTRAVENOUS | Status: AC
  Administered 2021-09-01: 2 g via INTRAVENOUS
  Filled 2021-09-01: qty 20

## 2021-09-01 MED ORDER — FENTANYL CITRATE PF 50 MCG/ML IJ SOSY
50.0000 ug | PREFILLED_SYRINGE | Freq: Once | INTRAMUSCULAR | Status: AC
  Administered 2021-09-01: 50 ug via INTRAVENOUS
  Filled 2021-09-01: qty 1

## 2021-09-01 NOTE — ED Provider Notes (Signed)
? ?[facility] ?Provider Note ? ? ? None  ?  (approximate) ? ?History  ? ?Chief Complaint: Chest Pain ? ?HPI ? ?Gina Cobb is a 66 y.o. female with a past medical history of hypertension, hyperlipidemia, presents emergency department for chest pain back pain and abdominal pain.  According to the patient this afternoon she developed a pressure to the center of her chest that was followed by more of a sharp pain to the chest and into the back.  She states since that time is progressed down the back into the right lower part of the back/right flank.  Patient appears uncomfortable.  No history of kidney stones previously.  States that chest pain has resolved somewhat but she was feeling short of breath.  The pain is now more located in the right lower back. ? ?Physical Exam  ? ?Triage Vital Signs: ?ED Triage Vitals [09/01/21 1324]  ?Enc Vitals Group  ?   BP (!) 181/72  ?   Pulse Rate 93  ?   Resp 20  ?   Temp 98.1 ?F (36.7 ?C)  ?   Temp src   ?   SpO2 100 %  ?   Weight   ?   Height   ?   Head Circumference   ?   Peak Flow   ?   Pain Score   ?   Pain Loc   ?   Pain Edu?   ?   Excl. in [place]?   ? ? ?Most recent vital signs: ?Vitals:  ? 09/01/21 1324  ?BP: (!) 181/72  ?Pulse: 93  ?Resp: 20  ?Temp: 98.1 ?F (36.7 ?C)  ?SpO2: 100%  ? ? ?General: Awake, appears uncomfortable on the recliner. ?CV:  Good peripheral perfusion.  Regular rate and rhythm  ?Resp:  Normal effort.  Equal breath sounds bilaterally.  ?Abd:  No distention.  Soft, nontender.  No rebound or guarding. ? ? ? ?ED Results / Procedures / Treatments  ? ?EKG ? ?EKG viewed and interpreted by myself shows a normal sinus rhythm at 98 bpm with a narrow QRS, normal axis, normal intervals, no concerning ST changes. ? ?RADIOLOGY ? ?Chest x-ray negative for acute process. ?I have reviewed the CTA no obvious dissection seen on my evaluation. ? ? ?MEDICATIONS ORDERED IN ED: ?Medications  ?ondansetron (ZOFRAN) injection 4 mg (4 mg Intravenous Given  09/01/21 1335)  ?fentaNYL (SUBLIMAZE) injection 50 mcg (50 mcg Intravenous Given 09/01/21 1439)  ?iohexol (OMNIPAQUE) 350 MG/ML injection 75 mL (75 mLs Intravenous Contrast Given 09/01/21 1447)  ? ? ? ?IMPRESSION / MDM / ASSESSMENT AND PLAN / ED COURSE  ?I reviewed the triage vital signs and the nursing notes. ? ?Patient presents emergency department for cute onset of chest discomfort shortness of breath and pain that has progressed from the chest to the back and down to the right lower back.  Differential would include aortic dissection, ureterolithiasis, ACS, pneumonia, pulmonary embolism.  We will treat the patient's pain.  Patient's lab work is within normal limits, normal CBC, normal chemistry, negative troponin.  Chest x-ray appears clear.  We will proceed with urgent CT angiography of the chest abdomen pelvis to rule out pulmonary embolism.  Patient agreeable to plan of care.  Patient is feeling somewhat better after fentanyl. ? ?CTA read pending.  Patient care signed out to oncoming provider. ? ?FINAL CLINICAL IMPRESSION(S) / ED DIAGNOSES  ? ?Chest pain ?Back pain ? ? ?Note:  This document was prepared  using Systems analyst and may include unintentional dictation errors. ?  ?Harvest Dark, MD ?09/03/21 1501 ? ?

## 2021-09-01 NOTE — ED Provider Notes (Signed)
66 year old female with history of hypertension, hyperlipidemia, here with severe onset back/flank and now lower abdominal pain.  Initial concern for dissection based on presentation.  CT angio obtained, reviewed by me, shows no evidence of dissection.  She does have what appears to be an obstructing stone in the left UVJ.  Will most of her pain is somewhat in the mid to right-sided back, on her CT, the kidney does appear slightly more medial than expected which could explain this.  Symptoms are otherwise highly consistent with this.  No evidence of AKI.  No fever, leukocytosis, or evidence of sepsis.  ? ?Of note, urinalysis obtained shows pyuria without overt hematuria.  This is somewhat unexpected as the patient has no infectious symptoms.  She does reportedly have a history of UTIs.  I reviewed this as well as the imaging with Dr. Diamantina Providence of urology.  The patient's pain is essentially resolved here.  She is hemodynamically stable.  No evidence to suggest sepsis.  He recommends trial of outpatient antibiotics and analgesics with close outpatient follow-up.  He will see her this week.  Patient is in agreement this plan.  With a long discussion regarding signs of infection and indications for return. ? ?  ?Duffy Bruce, MD ?09/01/21 2113 ? ?

## 2021-09-01 NOTE — ED Triage Notes (Signed)
Pt comes into the ED via EMS from home with c/o sudden chest pain and upper back pain started about 1hr PTA while vacuuming at home with SOB.Marland Kitchen ? ?172/71 ?97.6 ?100%RA ?HR95 ?CBG117 ?

## 2021-09-01 NOTE — ED Notes (Signed)
Pt dry heaving in traige.  ?

## 2021-09-01 NOTE — Discharge Instructions (Signed)
Your symptoms are likely due to a kidney stone, which should pass on its own over the next few days. ? ?Dr. Diamantina Providence, the Urologist on call, has asked the scheduler to call you. I'd recommend calling his office tomorrow to discuss follow-up. ? ?For now: ?- For mild to moderate pain, take Ibuprofen 600 mg every 6-8 hours ?- For severe pain, take the Oxycodone I have prescribed, 1-2 tablets every 6 hours ? ?Take the oxycodone with food and I'd recommend trying Zofran ODT with this to help with nausea. ? ?When taking the pain medicine, it's also a good idea to take an over-the-counter stool softener like Colace ?

## 2021-09-01 NOTE — ED Notes (Signed)
Called to subwaiting area to assess patient. C/o severe RLQ right flank pain and nausea.  AAOx3.  Skin warm and dry.  No SOB/ DOE.  NAD.  Presented patient to Dr. Kerman Passey.   Orders received and initiated. ?

## 2021-09-03 ENCOUNTER — Other Ambulatory Visit: Payer: Self-pay

## 2021-09-03 ENCOUNTER — Encounter: Payer: Self-pay | Admitting: Urology

## 2021-09-03 ENCOUNTER — Ambulatory Visit: Payer: Medicare Other | Admitting: Urology

## 2021-09-03 ENCOUNTER — Ambulatory Visit
Admission: RE | Admit: 2021-09-03 | Discharge: 2021-09-03 | Disposition: A | Discharge status: Home / Self Care | Payer: Medicare Other | Attending: Urology | Admitting: Urology

## 2021-09-03 ENCOUNTER — Ambulatory Visit
Admission: RE | Admit: 2021-09-03 | Discharge: 2021-09-03 | Disposition: A | Discharge status: Home / Self Care | Payer: Medicare Other | Source: Ambulatory Visit | Attending: Urology | Admitting: Urology

## 2021-09-03 VITALS — BP 158/74 | HR 80 | Ht 61.0 in | Wt 113.0 lb

## 2021-09-03 DIAGNOSIS — N200 Calculus of kidney: Secondary | ICD-10-CM

## 2021-09-03 DIAGNOSIS — R109 Unspecified abdominal pain: Secondary | ICD-10-CM

## 2021-09-03 DIAGNOSIS — Z87442 Personal history of urinary calculi: Secondary | ICD-10-CM | POA: Diagnosis not present

## 2021-09-03 DIAGNOSIS — N1330 Unspecified hydronephrosis: Secondary | ICD-10-CM

## 2021-09-03 LAB — URINE CULTURE

## 2021-09-03 NOTE — Patient Instructions (Addendum)
finish 5 days of cefdinir, can stop the flomax ?Dietary Guidelines to Help Prevent Kidney Stones ?Kidney stones are deposits of minerals and salts that form inside your kidneys. Your risk of developing kidney stones may be greater depending on your diet, your lifestyle, the medicines you take, and whether you have certain medical conditions. Most people can lower their chances of developing kidney stones by following the instructions below. Your dietitian may give you more specific instructions depending on your overall health and the type of kidney stones you tend to develop. ?What are tips for following this plan? ?Reading food labels ? ?Choose foods with "no salt added" or "low-salt" labels. Limit your salt (sodium) intake to less than 1,500 mg a day. ?Choose foods with calcium for each meal and snack. Try to eat about 300 mg of calcium at each meal. Foods that contain 200-500 mg of calcium a serving include: ?8 oz (237 mL) of milk, calcium-fortifiednon-dairy milk, and calcium-fortifiedfruit juice. Calcium-fortified means that calcium has been added to these drinks. ?8 oz (237 mL) of kefir, yogurt, and soy yogurt. ?4 oz (114 g) of tofu. ?1 oz (28 g) of cheese. ?1 cup (150 g) of dried figs. ?1 cup (91 g) of cooked broccoli. ?One 3 oz (85 g) can of sardines or mackerel. ?Most people need 1,000-1,500 mg of calcium a day. Talk to your dietitian about how much calcium is recommended for you. ?Shopping ?Buy plenty of fresh fruits and vegetables. Most people do not need to avoid fruits and vegetables, even if these foods contain nutrients that may contribute to kidney stones. ?When shopping for convenience foods, choose: ?Whole pieces of fruit. ?Pre-made salads with dressing on the side. ?Low-fat fruit and yogurt smoothies. ?Avoid buying frozen meals or prepared deli foods. These can be high in sodium. ?Look for foods with live cultures, such as yogurt and kefir. ?Choose high-fiber grains, such as whole-wheat breads,  oat bran, and wheat cereals. ?Cooking ?Do not add salt to food when cooking. Place a salt shaker on the table and allow each person to add his or her own salt to taste. ?Use vegetable protein, such as beans, textured vegetable protein (TVP), or tofu, instead of meat in pasta, casseroles, and soups. ?Meal planning ?Eat less salt, if told by your dietitian. To do this: ?Avoid eating processed or pre-made food. ?Avoid eating fast food. ?Eat less animal protein, including cheese, meat, poultry, or fish, if told by your dietitian. To do this: ?Limit the number of times you have meat, poultry, fish, or cheese each week. Eat a diet free of meat at least 2 days a week. ?Eat only one serving each day of meat, poultry, fish, or seafood. ?When you prepare animal protein, cut pieces into small portion sizes. For most meat and fish, one serving is about the size of the palm of your hand. ?Eat at least five servings of fresh fruits and vegetables each day. To do this: ?Keep fruits and vegetables on hand for snacks. ?Eat one piece of fruit or a handful of berries with breakfast. ?Have a salad and fruit at lunch. ?Have two kinds of vegetables at dinner. ?Limit foods that are high in a substance called oxalate. These include: ?Spinach (cooked), rhubarb, beets, sweet potatoes, and Swiss chard. ?Peanuts. ?Potato chips, french fries, and baked potatoes with skin on. ?Nuts and nut products. ?Chocolate. ?If you regularly take a diuretic medicine, make sure to eat at least 1 or 2 servings of fruits or vegetables that are high in  potassium each day. These include: ?Avocado. ?Banana. ?Orange, prune, carrot, or tomato juice. ?Baked potato. ?Cabbage. ?Beans and split peas. ?Lifestyle ? ?Drink enough fluid to keep your urine pale yellow. This is the most important thing you can do. Spread your fluid intake throughout the day. ?If you drink alcohol: ?Limit how much you use to: ?0-1 drink a day for women who are not pregnant. ?0-2 drinks a day  for men. ?Be aware of how much alcohol is in your drink. In the [place] one drink equals one 12 oz bottle of beer (355 mL), one 5 oz glass of wine (148 mL), or one 1? oz glass of hard liquor (44 mL). ?Lose weight if told by your health care provider. Work with your dietitian to find an eating plan and weight loss strategies that work best for you. ?General information ?Talk to your health care provider and dietitian about taking daily supplements. You may be told the following depending on your health and the cause of your kidney stones: ?Not to take supplements with vitamin C. ?To take a calcium supplement. ?To take a daily probiotic supplement. ?To take other supplements such as magnesium, fish oil, or vitamin B6. ?Take over-the-counter and prescription medicines only as told by your health care provider. These include supplements. ?What foods should I limit? ?Limit your intake of the following foods, or eat them as told by your dietitian. ?Vegetables ?Spinach. Rhubarb. Beets. Canned vegetables. Gina Cobb. Olives. Baked potatoes with skin. ?Grains ?Wheat bran. Baked goods. Salted crackers. Cereals high in sugar. ?Meats and other proteins ?Nuts. Nut butters. Large portions of meat, poultry, or fish. Salted, precooked, or cured meats, such as sausages, meat loaves, and hot dogs. ?Dairy ?Cheese. ?Beverages ?Regular soft drinks. Regular vegetable juice. ?Seasonings and condiments ?Seasoning blends with salt. Salad dressings. Soy sauce. Ketchup. Barbecue sauce. ?Other foods ?Canned soups. Canned pasta sauce. Casseroles. Pizza. Lasagna. Frozen meals. Potato chips. [place] fries. ?The items listed above may not be a complete list of foods and beverages you should limit. Contact a dietitian for more information. ?What foods should I avoid? ?Talk to your dietitian about specific foods you should avoid based on the type of kidney stones you have and your overall health. ?Fruits ?Grapefruit. ?The item listed above may not be a  complete list of foods and beverages you should avoid. Contact a dietitian for more information. ?Summary ?Kidney stones are deposits of minerals and salts that form inside your kidneys. ?You can lower your risk of kidney stones by making changes to your diet. ?The most important thing you can do is drink enough fluid. Drink enough fluid to keep your urine pale yellow. ?Talk to your dietitian about how much calcium you should have each day, and eat less salt and animal protein as told by your dietitian. ?This information is not intended to replace advice given to you by your health care provider. Make sure you discuss any questions you have with your health care provider. ?Document Revised: 12/23/2020 Document Reviewed: 12/23/2020 ?Elsevier Patient Education ? [place]. ? ?

## 2021-09-03 NOTE — Progress Notes (Signed)
? ?09/03/21 ?11:30 AM  ? ?Gina Cobb ?01/01/1956 ?[id number] ? ?CC: Left distal ureteral stone ? ?HPI: ?66 year old female who presented to the ED on 09/01/2021 with acute onset of severe back pain and nausea vomiting.  Work-up with CT showed a 1 mm left distal ureteral stone with upstream hydronephrosis, and urinalysis showed greater than 50 WBCs, 0-5 RBCs, large leukocytes, no bacteria, nitrite negative.  She was discharged with Flomax and cefdinir.  Urine culture ultimately showed multiple species.  She passed a small stone fragment the next day that she brought with her today and certainly appears to be consistent with a 1 mm ureteral stone.  She has had some persistent mild right-sided flank discomfort, but overall feels well.  She denies any urinary symptoms or fevers.  No prior stone episodes. ? ? ?PMH: ?Past Medical History:  ?Diagnosis Date  ? Anxiety   ? Chest pain   ? Dyspnea   ? Apparently had an abnormal myoview in 2007 followed by [facility] in 11/07 with EF 65%, normal coronaries. Stress echo (12/11): Baseline echo with EF 40-98%, normal diastolic function, no pulmonary hypertension, no significant valvular abnormalities; stress ECG showed ST changes but stress echo images were normal, suspect that this was a negative study  ? Hyperlipidemia   ? Hypertension   ? Hypothyroidism   ? PUD (peptic ulcer disease)   ? distant history  ? ? ?Surgical History: ?Past Surgical History:  ?Procedure Laterality Date  ? BREAST EXCISIONAL BIOPSY Left 09/2012  ? [facility]  ? CARDIAC CATHETERIZATION  2009  ? at [facility]- normal  ? ESOPHAGOGASTRODUODENOSCOPY (EGD) WITH PROPOFOL N/A 11/17/2019  ? Procedure: ESOPHAGOGASTRODUODENOSCOPY (EGD) WITH PROPOFOL;  Surgeon: Lin Landsman, MD;  Location: [facility] ENDOSCOPY;  Service: Gastroenterology;  Laterality: N/A;  ? EYE SURGERY Left   ? ? ? ?Family History: ?Family History  ?Problem Relation Age of Onset  ? Hypertension Mother   ? Heart attack Father 51  ? Breast cancer Sister 83  ? Heart  attack Brother 2  ? Breast cancer Paternal Grandmother 34  ? Bladder Cancer Neg Hx   ? Prostate cancer Neg Hx   ? Kidney cancer Neg Hx   ? ? ?Social History:  reports that she has never smoked. She has never used smokeless tobacco. She reports current alcohol use. She reports that she does not use drugs. ? ?Physical Exam: ?BP (!) 158/74   Pulse 80   Ht '5\' 1"'$  (1.549 m)   Wt 113 lb (51.3 kg)   BMI 21.35 kg/m?   ? ?Constitutional:  Alert and oriented, No acute distress. ?Cardiovascular: No clubbing, cyanosis, or edema. ?Respiratory: Normal respiratory effort, no increased work of breathing. ?GI: Abdomen is soft, nontender, nondistended, no abdominal masses ? ? ?Laboratory Data: ?Reviewed, see HPI ? ?Pertinent Imaging: ?I have personally viewed and interpreted the CT showing a 1 mm left distal ureteral stone with hydronephrosis, as well as the KUB today that shows no definitive evidence of persistent stone. ? ?Assessment & Plan:   ?66 year old female who likely spontaneously passed a 1 mm left distal ureteral stone with resolution of her symptoms.  I recommended she complete the 5-day course of cefdinir, and she can likely discontinue the Flomax.  Return precautions discussed extensively, and I encouraged her to reach out to her PCP if she has persistent right-sided back pain that would be unlikely to be related to her recently passed left distal ureteral stone. ? ?Follow-up with urology as needed, return precautions discussed ? ? ?  Nickolas Madrid, MD ?09/03/2021 ? ?[facility] ?[address], [address] ?[place], [place] [address] ?[phone] ? ? ?

## 2021-12-09 ENCOUNTER — Ambulatory Visit: Payer: Medicare Other | Admitting: Dermatology

## 2021-12-09 DIAGNOSIS — L219 Seborrheic dermatitis, unspecified: Secondary | ICD-10-CM | POA: Diagnosis not present

## 2021-12-09 DIAGNOSIS — D1800 Hemangioma unspecified site: Secondary | ICD-10-CM | POA: Diagnosis not present

## 2021-12-09 DIAGNOSIS — L821 Other seborrheic keratosis: Secondary | ICD-10-CM | POA: Diagnosis not present

## 2021-12-09 MED ORDER — TACROLIMUS 0.1 % EX OINT: TOPICAL_OINTMENT | CUTANEOUS | 2 refills | Status: AC

## 2021-12-09 NOTE — Patient Instructions (Signed)
Recommend taking Heliocare sun protection supplement daily in sunny weather for additional sun protection. For maximum protection on the sunniest days, you can take up to 2 capsules of regular Heliocare OR take 1 capsule of Heliocare Ultra. For prolonged exposure (such as a full day in the sun), you can repeat your dose of the supplement 4 hours after your first dose. Heliocare can be purchased at [facility], at some [facility] or at [email or web address].    Melanoma ABCDEs  Melanoma is the most dangerous type of skin cancer, and is the leading cause of death from skin disease.  You are more likely to develop melanoma if you: Have light-colored skin, light-colored eyes, or red or blond hair Spend a lot of time in the sun Tan regularly, either outdoors or in a tanning bed Have had blistering sunburns, especially during childhood Have a close family member who has had a melanoma Have atypical moles or large birthmarks  Early detection of melanoma is key since treatment is typically straightforward and cure rates are extremely high if we catch it early.   The first sign of melanoma is often a change in a mole or a new dark spot.  The ABCDE system is a way of remembering the signs of melanoma.  A for asymmetry:  The two halves do not match. B for border:  The edges of the growth are irregular. C for color:  A mixture of colors are present instead of an even brown color. D for diameter:  Melanomas are usually (but not always) greater than 6mm - the size of a pencil eraser. E for evolution:  The spot keeps changing in size, shape, and color.  Please check your skin once per month between visits. You can use a small mirror in front and a large mirror behind you to keep an eye on the back side or your body.   If you see any new or changing lesions before your next follow-up, please call to schedule a visit.  Please continue daily skin protection including broad spectrum sunscreen SPF 30+ to  sun-exposed areas, reapplying every 2 hours as needed when you're outdoors.    Due to recent changes in healthcare laws, you may see results of your pathology and/or laboratory studies on MyChart before the doctors have had a chance to review them. We understand that in some cases there may be results that are confusing or concerning to you. Please understand that not all results are received at the same time and often the doctors may need to interpret multiple results in order to provide you with the best plan of care or course of treatment. Therefore, we ask that you please give us 2 business days to thoroughly review all your results before contacting the office for clarification. Should we see a critical lab result, you will be contacted sooner.   If You Need Anything After Your Visit  If you have any questions or concerns for your doctor, please call our main line at [phone] and press option 4 to reach your doctor's medical assistant. If no one answers, please leave a voicemail as directed and we will return your call as soon as possible. Messages left after 4 pm will be answered the following business day.   You may also send us a message via MyChart. We typically respond to MyChart messages within 1-2 business days.  For prescription refills, please ask your pharmacy to contact our office. Our fax number is [phone].  If you have   an urgent issue when the clinic is closed that cannot wait until the next business day, you can page your doctor at the number below.    Please note that while we do our best to be available for urgent issues outside of office hours, we are not available 24/7.   If you have an urgent issue and are unable to reach us, you may choose to seek medical care at your doctor's office, retail clinic, urgent care center, or emergency room.  If you have a medical emergency, please immediately call 911 or go to the emergency department.  Pager Numbers  - Dr.  Kowalski: [phone]  - Dr. Moye: [phone]  - Dr. Stewart: [phone]  In the event of inclement weather, please call our main line at [phone] for an update on the status of any delays or closures.  Dermatology Medication Tips: Please keep the boxes that topical medications come in in order to help keep track of the instructions about where and how to use these. Pharmacies typically print the medication instructions only on the boxes and not directly on the medication tubes.   If your medication is too expensive, please contact our office at [phone] option 4 or send us a message through MyChart.   We are unable to tell what your co-pay for medications will be in advance as this is different depending on your insurance coverage. However, we may be able to find a substitute medication at lower cost or fill out paperwork to get insurance to cover a needed medication.   If a prior authorization is required to get your medication covered by your insurance company, please allow us 1-2 business days to complete this process.  Drug prices often vary depending on where the prescription is filled and some pharmacies may offer cheaper prices.  The website www.goodrx.com contains coupons for medications through different pharmacies. The prices here do not account for what the cost may be with help from insurance (it may be cheaper with your insurance), but the website can give you the price if you did not use any insurance.  - You can print the associated coupon and take it with your prescription to the pharmacy.  - You may also stop by our office during regular business hours and pick up a GoodRx coupon card.  - If you need your prescription sent electronically to a different pharmacy, notify our office through [facility] MyChart or by phone at [phone] option 4.     Si Usted Necesita Algo Despus de Su Visita  Tambin puede enviarnos un mensaje a travs de MyChart. Por lo  general respondemos a los mensajes de MyChart en el transcurso de 1 a 2 das hbiles.  Para renovar recetas, por favor pida a su farmacia que se ponga en contacto con nuestra oficina. Nuestro nmero de fax es el [phone].  Si tiene un asunto urgente cuando la clnica est cerrada y que no puede esperar hasta el siguiente da hbil, puede llamar/localizar a su doctor(a) al nmero que aparece a continuacin.   Por favor, tenga en cuenta que aunque hacemos todo lo posible para estar disponibles para asuntos urgentes fuera del horario de oficina, no estamos disponibles las 24 horas del da, los 7 das de la semana.   Si tiene un problema urgente y no puede comunicarse con nosotros, puede optar por buscar atencin mdica  en el consultorio de su doctor(a), en una clnica privada, en un centro de atencin urgente o en una sala de   emergencias.  Si tiene una emergencia mdica, por favor llame inmediatamente al 911 o vaya a la sala de emergencias.  Nmeros de bper  - Dr. Kowalski: [phone]  - Dra. Moye: [phone]  - Dra. Stewart: [phone]  En caso de inclemencias del tiempo, por favor llame a nuestra lnea principal al [phone] para una actualizacin sobre el estado de cualquier retraso o cierre.  Consejos para la medicacin en dermatologa: Por favor, guarde las cajas en las que vienen los medicamentos de uso tpico para ayudarle a seguir las instrucciones sobre dnde y cmo usarlos. Las farmacias generalmente imprimen las instrucciones del medicamento slo en las cajas y no directamente en los tubos del medicamento.   Si su medicamento es muy caro, por favor, pngase en contacto con nuestra oficina llamando al [phone] y presione la opcin 4 o envenos un mensaje a travs de MyChart.   No podemos decirle cul ser su copago por los medicamentos por adelantado ya que esto es diferente dependiendo de la cobertura de su seguro. Sin embargo, es posible que podamos encontrar un  medicamento sustituto a menor costo o llenar un formulario para que el seguro cubra el medicamento que se considera necesario.   Si se requiere una autorizacin previa para que su compaa de seguros cubra su medicamento, por favor permtanos de 1 a 2 das hbiles para completar este proceso.  Los precios de los medicamentos varan con frecuencia dependiendo del lugar de dnde se surte la receta y alguna farmacias pueden ofrecer precios ms baratos.  El sitio web www.goodrx.com tiene cupones para medicamentos de diferentes farmacias. Los precios aqu no tienen en cuenta lo que podra costar con la ayuda del seguro (puede ser ms barato con su seguro), pero el sitio web puede darle el precio si no utiliz ningn seguro.  - Puede imprimir el cupn correspondiente y llevarlo con su receta a la farmacia.  - Tambin puede pasar por nuestra oficina durante el horario de atencin regular y recoger una tarjeta de cupones de GoodRx.  - Si necesita que su receta se enve electrnicamente a una farmacia diferente, informe a nuestra oficina a travs de MyChart de [place] o por telfono llamando al [phone] y presione la opcin 4.  

## 2021-12-09 NOTE — Progress Notes (Signed)
   Follow-Up Visit   Subjective  Gina Cobb is a 66 y.o. female who presents for the following: Skin Problem (Patient here today for dry skin in ears and refill for topical medication. ).  Patient uses something everyday to treat. Unable to find what we have given patient in the past in records.   The following portions of the chart were reviewed this encounter and updated as appropriate:   Tobacco  Allergies  Meds  Problems  Med Hx  Surg Hx  Fam Hx      Review of Systems:  No other skin or systemic complaints except as noted in HPI or Assessment and Plan.  Objective  Well appearing patient in no apparent distress; mood and affect are within normal limits.  A focused examination was performed including face, neck, chest and back. Relevant physical exam findings are noted in the Assessment and Plan.  Right Ear Pink patches with greasy scale.     Assessment & Plan  Seborrheic dermatitis Right Ear  Chronic and persistent condition with duration or expected duration over one year. Condition is bothersome/symptomatic for patient. Currently flared.  Patient has to use topical steroid daily to help keep inflammation calmed down.  Seborrheic Dermatitis  -  is a chronic persistent rash characterized by pinkness and scaling most commonly of the mid face but also can occur on the scalp (dandruff), ears; mid chest, mid back and groin.  It tends to be exacerbated by stress and cooler weather.  People who have neurologic disease may experience new onset or exacerbation of existing seborrheic dermatitis.  The condition is not curable but treatable and can be controlled.  Start tacrolimus ointment 1-2 times daily as needed.   tacrolimus (PROTOPIC) 0.1 % ointment - Right Ear Apply 1-2 times daily as needed to ears.  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 - Observe - Call for any changes - Recommend The Perfect Dermapeel for patient to treat brown spots at face   Return in about 1 year (around 12/10/2022).  Graciella Belton, RMA, am acting as scribe for Forest Gleason, MD .  Documentation: I have reviewed the above documentation for accuracy and completeness, and I agree with the above.  Forest Gleason, MD

## 2021-12-19 ENCOUNTER — Encounter: Payer: Self-pay | Admitting: Dermatology

## 2022-01-16 ENCOUNTER — Other Ambulatory Visit: Payer: Self-pay | Admitting: Family Medicine

## 2022-01-16 DIAGNOSIS — Z1231 Encounter for screening mammogram for malignant neoplasm of breast: Secondary | ICD-10-CM

## 2022-02-10 ENCOUNTER — Ambulatory Visit
Admission: RE | Admit: 2022-02-10 | Discharge: 2022-02-10 | Disposition: A | Discharge status: Home / Self Care | Payer: Medicare Other | Source: Ambulatory Visit | Attending: Family Medicine | Admitting: Family Medicine

## 2022-02-10 DIAGNOSIS — Z1231 Encounter for screening mammogram for malignant neoplasm of breast: Secondary | ICD-10-CM | POA: Diagnosis present

## 2022-07-07 LAB — COLOGUARD: COLOGUARD: NEGATIVE

## 2022-08-15 IMAGING — CR DG ABDOMEN 1V
1 series · 2 of 2 positions shown · non-contrast
Comparison: CT a 09/01/2021

CLINICAL DATA: Nephrolithiasis.  Patient reports right flank pain.

EXAM:
ABDOMEN - 1 VIEW

[Series 1: dg abd 1 view · 0.14mm/px · 2 of 2 slices shown]
[im 1/2]
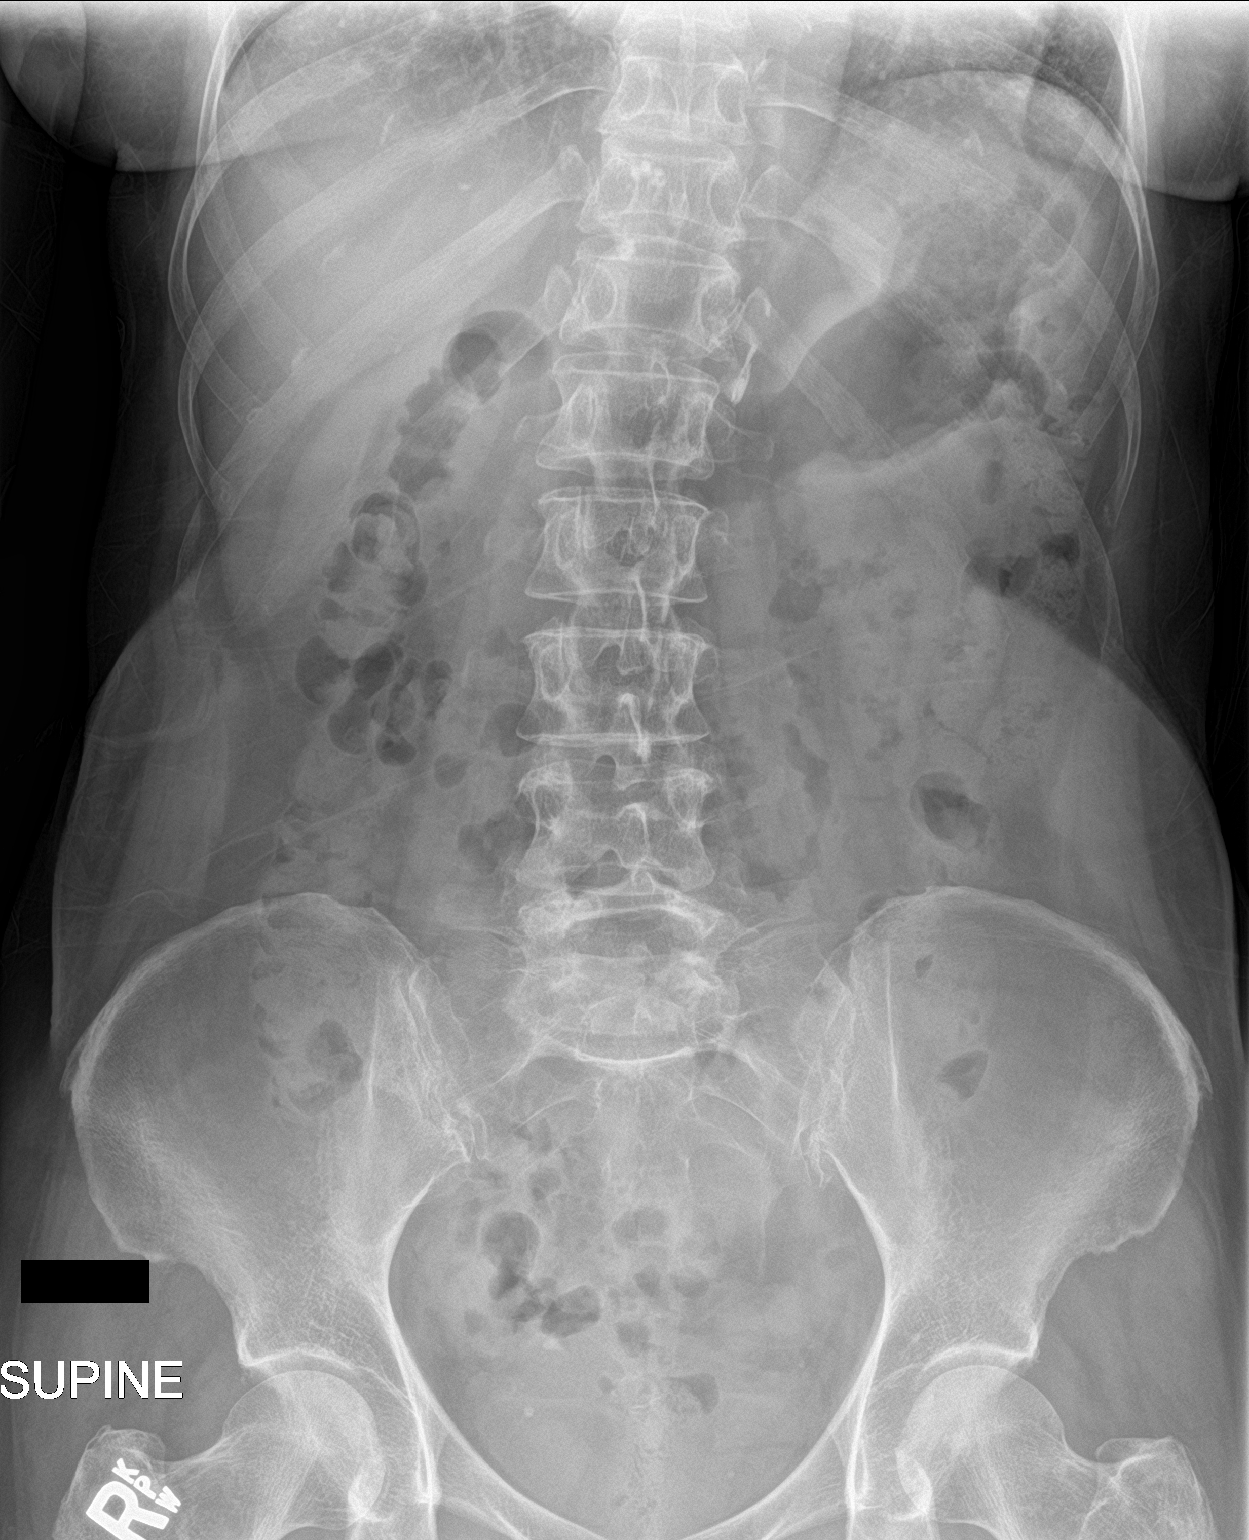
[im 2/2]
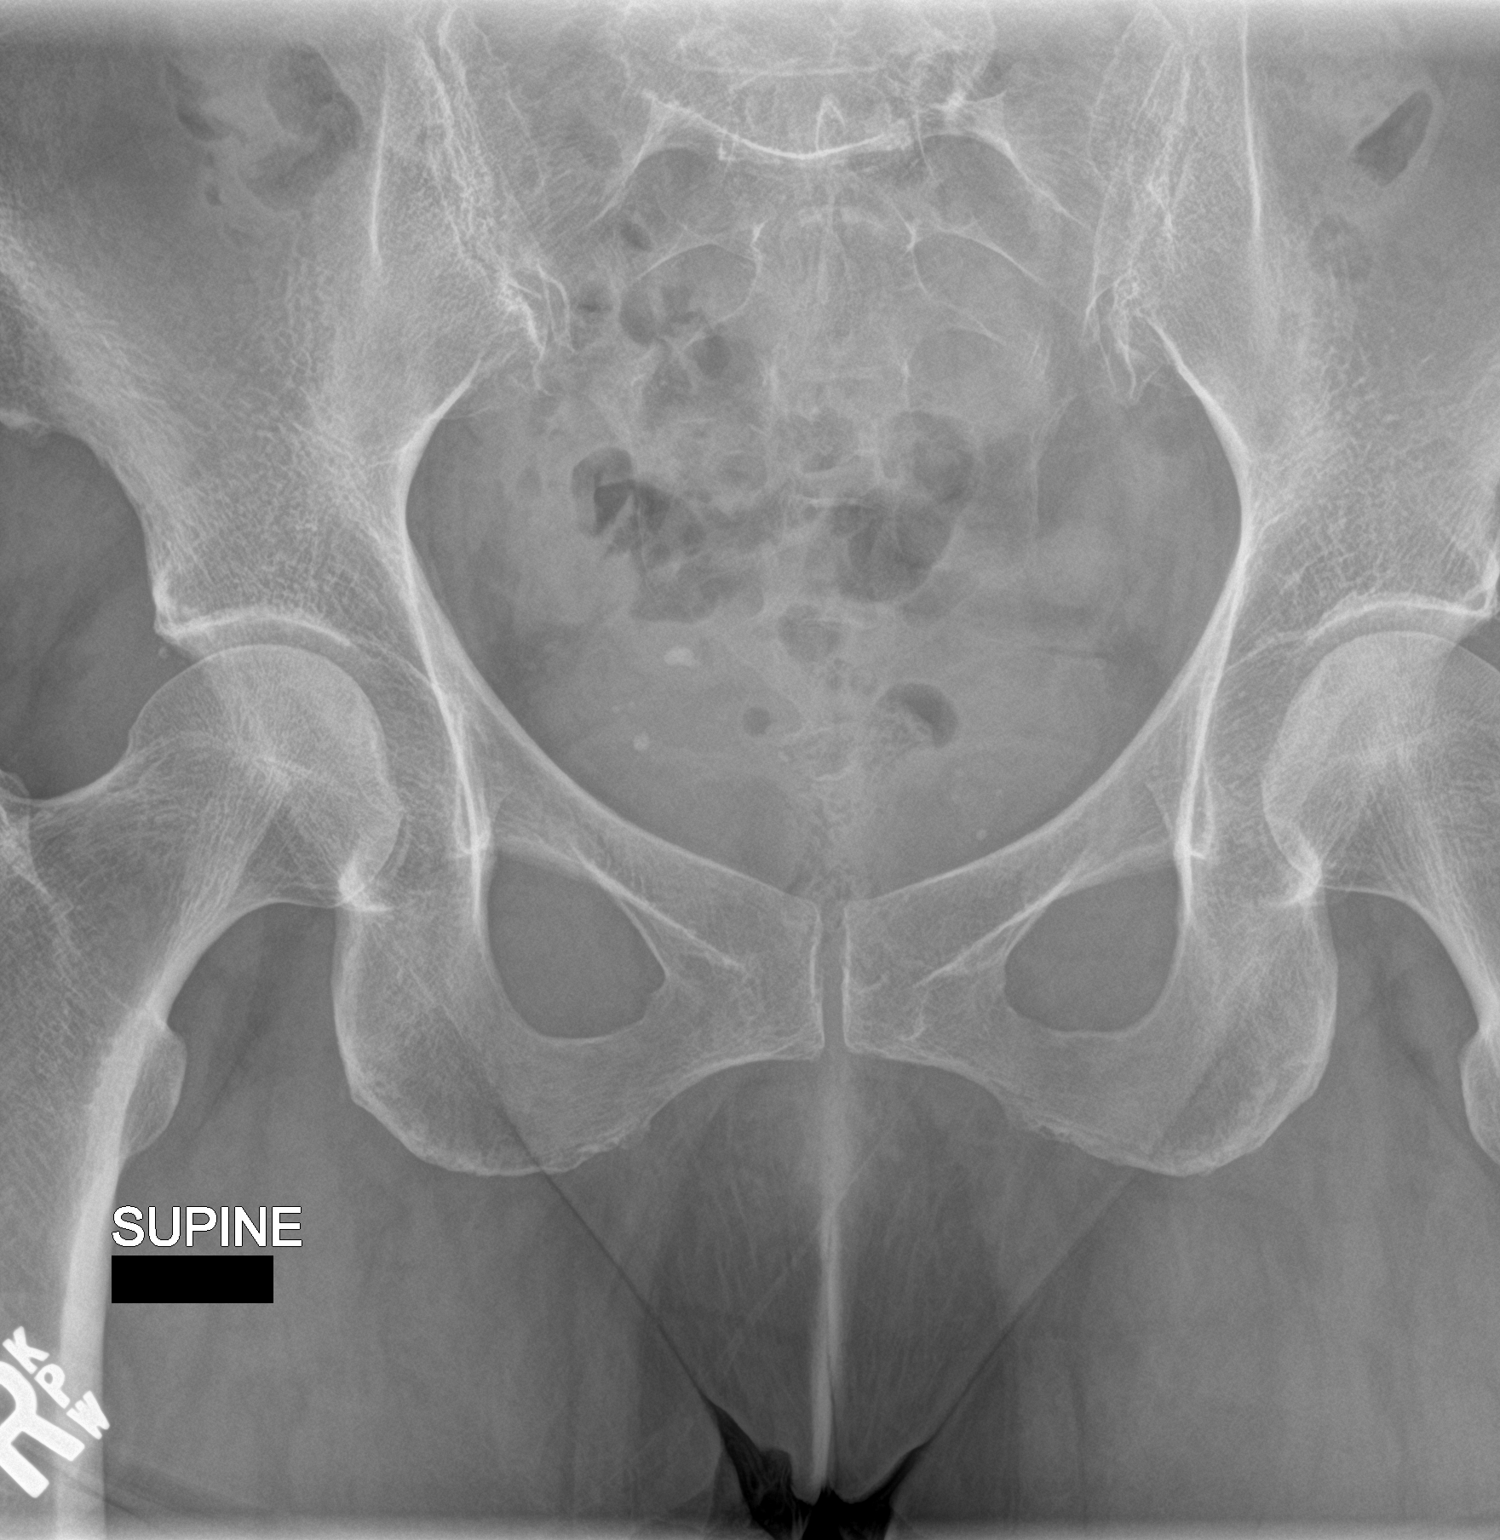

[2 of 2 positions shown; findings below may reference images not displayed]

FINDINGS: There are multiple small calcifications in the left hemipelvis, 1 of
which may represent a distal ureteral stone on CT, the others
representing phleboliths. Calcifications in the right pelvis
correspond to phleboliths. No calcifications project over the renal
beds. Normal bowel gas pattern. Right upper quadrant calcifications
correspond to coarse liver calcifications on CT.
IMPRESSION: The previous punctate stone at the left ureterovesicular junction is
tentatively visualized, with additional left pelvic phleboliths.

## 2022-08-18 ENCOUNTER — Encounter: Payer: Self-pay | Admitting: Dermatology

## 2022-08-18 ENCOUNTER — Ambulatory Visit: Payer: Medicare Other | Admitting: Dermatology

## 2022-08-18 VITALS — BP 136/76 | HR 69

## 2022-08-18 DIAGNOSIS — L408 Other psoriasis: Secondary | ICD-10-CM

## 2022-08-18 DIAGNOSIS — S70262A Insect bite (nonvenomous), left hip, initial encounter: Secondary | ICD-10-CM | POA: Diagnosis not present

## 2022-08-18 DIAGNOSIS — L853 Xerosis cutis: Secondary | ICD-10-CM

## 2022-08-18 DIAGNOSIS — R202 Paresthesia of skin: Secondary | ICD-10-CM | POA: Diagnosis not present

## 2022-08-18 DIAGNOSIS — L905 Scar conditions and fibrosis of skin: Secondary | ICD-10-CM

## 2022-08-18 NOTE — Patient Instructions (Addendum)
Can see Dr. Willeen Niece in our office going forward or Dr Langston Reusing at [facility] [address] [place], [place] [address] [phone]   Ears: Start Vtama cream once daily on one ear, Start Zoryve cream once daily on other ear  Inset bite on hip: Use Tacrolimus ointment twice daily as needed.  Itching area on Back: OTC treatments which can help with itch include numbing creams like pramoxine or lidocaine which temporarily reduce itch or Capsaicin-containing creams which cause a burning sensation but which sometimes over time will reset the nerves to stop producing itch.  If you choose to use Capsaicin cream, it is recommended to use it 5 times daily for 1 week followed by 3 times daily for 3-6 weeks. You may have to continue using it long-term.    Recommend OTC Gold Bond Rapid Relief Anti-Itch cream (pramoxine + menthol), CeraVe Anti-itch cream or lotion (pramoxine), Sarna lotion (Original- menthol + camphor or Sensitive- pramoxine) or Eucerin 12 hour Itch Relief lotion (menthol) up to 3 times per day to areas on body that are itchy.     Recommend daily broad spectrum sunscreen SPF 30+ to sun-exposed areas, reapply every 2 hours as needed. Call for new or changing lesions.  Staying in the shade or wearing long sleeves, sun glasses (UVA+UVB protection) and wide brim hats (4-inch brim around the entire circumference of the hat) are also recommended for sun protection.    Recommend taking Heliocare sun protection supplement daily in sunny weather for additional sun protection. For maximum protection on the sunniest days, you can take up to 2 capsules of regular Heliocare OR take 1 capsule of Heliocare Ultra. For prolonged exposure (such as a full day in the sun), you can repeat your dose of the supplement 4 hours after your first dose. Heliocare can be purchased at [employer], at some [facility] or at [email or web address].       Gentle Skin Care  Guide  1. Bathe no more than once a day.  2. Avoid bathing in hot water  3. Use a mild soap like Dove, Vanicream, Cetaphil, CeraVe. Can use Lever 2000 or Cetaphil antibacterial soap  4. Use soap only where you need it. On most days, use it under your arms, between your legs, and on your feet. Let the water rinse other areas unless visibly dirty.  5. When you get out of the bath/shower, use a towel to gently blot your skin dry, don't rub it.  6. While your skin is still a little damp, apply a moisturizing cream such as Vanicream, CeraVe, Cetaphil, Eucerin, Sarna lotion or plain Vaseline Jelly. For hands apply Neutrogena [place] Hand Cream or Excipial Hand Cream.  7. Reapply moisturizer any time you start to itch or feel dry.  8. Sometimes using free and clear laundry detergents can be helpful. Fabric softener sheets should be avoided. Downy Free & Gentle liquid, or any liquid fabric softener that is free of dyes and perfumes, it acceptable to use  9. If your doctor has given you prescription creams you may apply moisturizers over them     Due to recent changes in healthcare laws, you may see results of your pathology and/or laboratory studies on MyChart before the doctors have had a chance to review them. We understand that in some cases there may be results that are confusing or concerning to you. Please understand that not all results are received at the same time and often the doctors may need to  interpret multiple results in order to provide you with the best plan of care or course of treatment. Therefore, we ask that you please give [place] 2 business days to thoroughly review all your results before contacting the office for clarification. Should we see a critical lab result, you will be contacted sooner.   If You Need Anything After Your Visit  If you have any questions or concerns for your doctor, please call our main line at [phone] and press option 4 to reach your doctor's  medical assistant. If no one answers, please leave a voicemail as directed and we will return your call as soon as possible. Messages left after 4 pm will be answered the following business day.   You may also send [place] a message via MyChart. We typically respond to MyChart messages within 1-2 business days.  For prescription refills, please ask your pharmacy to contact our office. Our fax number is [phone].  If you have an urgent issue when the clinic is closed that cannot wait until the next business day, you can page your doctor at the number below.    Please note that while we do our best to be available for urgent issues outside of office hours, we are not available 24/7.   If you have an urgent issue and are unable to reach [place], you may choose to seek medical care at your doctor's office, retail clinic, urgent care center, or emergency room.  If you have a medical emergency, please immediately call 911 or go to the emergency department.  Pager Numbers  - Dr. Gwen Pounds: [phone]  - Dr. Neale Burly: [phone]  - Dr. Roseanne Reno: [phone]  In the event of inclement weather, please call our main line at [phone] for an update on the status of any delays or closures.  Dermatology Medication Tips: Please keep the boxes that topical medications come in in order to help keep track of the instructions about where and how to use these. Pharmacies typically print the medication instructions only on the boxes and not directly on the medication tubes.   If your medication is too expensive, please contact our office at [phone] option 4 or send [place] a message through MyChart.   We are unable to tell what your co-pay for medications will be in advance as this is different depending on your insurance coverage. However, we may be able to find a substitute medication at lower cost or fill out paperwork to get insurance to cover a needed medication.   If a prior authorization is required to  get your medication covered by your insurance company, please allow [place] 1-2 business days to complete this process.  Drug prices often vary depending on where the prescription is filled and some pharmacies may offer cheaper prices.  The website www.goodrx.com contains coupons for medications through different pharmacies. The prices here do not account for what the cost may be with help from insurance (it may be cheaper with your insurance), but the website can give you the price if you did not use any insurance.  - You can print the associated coupon and take it with your prescription to the pharmacy.  - You may also stop by our office during regular business hours and pick up a GoodRx coupon card.  - If you need your prescription sent electronically to a different pharmacy, notify our office through [facility] or by phone at [phone] option 4.     Si Usted Necesita Algo Despus de Su Visita  Tambin puede enviarnos un mensaje a travs de MyChart. Por lo general respondemos a los mensajes de MyChart en el transcurso de 1 a 2 das hbiles.  Para renovar recetas, por favor pida a su farmacia que se ponga en contacto con nuestra oficina. Annie Sable de fax es [place] [phone].  Si tiene un asunto urgente cuando la clnica est cerrada y que no puede esperar hasta el siguiente da hbil, puede llamar/localizar a su doctor(a) al nmero que aparece a continuacin.   Por favor, tenga en cuenta que aunque hacemos todo lo posible para estar disponibles para asuntos urgentes fuera del horario de [place], no estamos disponibles las 24 horas del da, los 7 [address]  [address] de la [place].   Si tiene un problema urgente y no puede comunicarse con nosotros, puede optar por buscar atencin mdica  en el consultorio de su doctor(a), en una clnica privada, en un centro de atencin urgente o en una sala de emergencias.  Si tiene Engineer, drilling, por favor llame inmediatamente al 911 o vaya a la sala de  emergencias.  Nmeros de bper  - Dr. Gwen Pounds: [phone]  - Dra. Moye: [phone]  - Dra. Roseanne Reno: [phone]  En caso de inclemencias del [place], por favor llame a Lacy Duverney principal al [phone] para una actualizacin sobre el [place] de cualquier retraso o cierre.  Consejos para la medicacin en dermatologa: Por favor, guarde las cajas en las que vienen los medicamentos de uso tpico para ayudarle a seguir las instrucciones sobre dnde y cmo usarlos. Las farmacias generalmente imprimen las instrucciones del medicamento slo en las cajas y no directamente en los tubos del [place].   Si su medicamento es muy caro, por favor, pngase en contacto con Rolm [place]ala llamando al [phone] y presione la opcin 4 o envenos un mensaje a travs de Clinical cytogeneticist.   No podemos decirle cul ser su copago por los medicamentos por adelantado ya que esto es diferente dependiendo de la cobertura de su seguro. Sin embargo, es posible que podamos encontrar un medicamento sustituto a Audiological scientist un formulario para que el seguro cubra el medicamento que se considera necesario.   Si se requiere una autorizacin previa para que su compaa de seguros [place] su medicamento, por favor permtanos de 1 a 2 das hbiles para completar [address]  Los precios de los medicamentos varan con frecuencia dependiendo del Environmental consultant de dnde se surte la receta y alguna farmacias pueden ofrecer precios ms baratos.  El sitio web www.goodrx.com tiene cupones para medicamentos de Health and safety inspector. Los precios aqu no tienen en cuenta lo que podra costar con la ayuda del seguro (puede ser ms barato con su seguro), pero el sitio web puede darle el precio si no utiliz Tourist information centre manager.  - Puede imprimir el cupn correspondiente y llevarlo con su receta a la farmacia.  - Tambin puede pasar por nuestra oficina durante el horario de atencin regular y Education officer, museum una tarjeta de cupones de GoodRx.  - Si  necesita que su receta se enve electrnicamente a una farmacia diferente, informe a nuestra oficina a travs de MyChart de [place] o por telfono llamando al [phone] y presione la opcin 4.

## 2022-08-18 NOTE — Progress Notes (Signed)
Follow-Up Visit   Subjective  Gina Cobb is a 67 y.o. female who presents for the following: Spot at left shoulder. Dur: noticed 2 weeks ago. Non tender. Itched. Scabbed over. Brother passed away from MM. Concerned could be cancer. Had black spot when first noticed.   The patient has spots, moles and lesions to be evaluated, some may be new or changing and the patient has concerns that these could be cancer.   The following portions of the chart were reviewed this encounter and updated as appropriate: medications, allergies, medical history  Review of Systems:  No other skin or systemic complaints except as noted in HPI or Assessment and Plan.  Objective  Well appearing patient in no apparent distress; mood and affect are within normal limits.  A focused examination was performed of the following areas: Left shoulder  Relevant physical exam findings are noted in the Assessment and Plan.    Assessment & Plan   SCAR Exam:   0.4 cm think pink papule without features suspicious for malignancy on dermoscopy at top of left shoulder.  Benign-appearing.  Observation.  Call clinic for new or changing lesions. Recommend daily broad spectrum sunscreen SPF 30+, reapply every 2 hours as needed. Treatment: Recommend Serica moisturizing scar formula cream every night or [employer] brand or Mederma silicone scar sheet every night for the first year after a scar appears to help with scar remodeling if desired. Scars remodel on their own for a full year and will gradually improve in appearance over time.  Favor psoriasis > Seborrheic dermatitis Right Ear   Chronic and persistent condition with duration or expected duration over one year. Condition is bothersome/symptomatic for patient. Currently flared.   Patient has to use topical steroid daily to help keep inflammation calmed down.   Reviewed chronic nature. Reviewed link with psoriatic arthritis including the risk of damage to joints and  need for early treatment. Reviewed the risk of heart disease, inflammatory bowel disease and depression.     Start Vtama cream once daily on one ear, Start Zoryve cream once daily on other ear. Call for prescription of one that works best.  If not helping can add in topical corticosteroid on weekends.   Xerosis with pruritis - diffuse xerotic patches - recommend gentle, hydrating skin care - gentle skin care handout given   Insect Bite Slightly edematous pink papule without features suspicious for malignancy on dermoscopy at left hip  Use Tacrolimus ointment twice daily as needed.   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  Chronic condition without cure secondary to pinched nerve along spine causing itching or sensation changes in an area of skin. Chronic rubbing or scratching causes darkening of the skin.  OTC treatments which can help with itch include numbing creams like pramoxine or lidocaine which temporarily reduce itch or Capsaicin-containing creams which cause a burning sensation but which sometimes over time will reset the nerves to stop producing itch.  If you choose to use Capsaicin cream, it is recommended to use it 5 times daily for 1 week followed by 3 times daily for 3-6 weeks. You may have to continue using it long-term.  If not doing well with OTC options, could consider [employer] compounded prescription anti-itch cream with Amitriptyline 5% / Lidocaine 5% / Pramoxine 1% or Amitriptyline 5% / Gabapentin 10% / Lidocaine 5% Cream or other prescription cream or pill options.     Return for TBSE As Scheduled.  I, Lawson Radar, CMA, am  acting as scribe for Darden Dates, MD.   Documentation: I have reviewed the above documentation for accuracy and completeness, and I agree with the above.  Darden Dates, MD

## 2022-09-01 ENCOUNTER — Other Ambulatory Visit
Admission: RE | Admit: 2022-09-01 | Discharge: 2022-09-01 | Disposition: A | Discharge status: Home / Self Care | Payer: Medicare Other | Source: Ambulatory Visit | Attending: Cardiovascular Disease | Admitting: Cardiovascular Disease

## 2022-09-01 ENCOUNTER — Encounter: Payer: Self-pay | Admitting: Cardiovascular Disease

## 2022-09-01 ENCOUNTER — Ambulatory Visit: Payer: Medicare Other | Attending: Cardiovascular Disease | Admitting: Cardiovascular Disease

## 2022-09-01 VITALS — BP 179/83 | HR 63 | Ht 61.0 in | Wt 115.1 lb

## 2022-09-01 DIAGNOSIS — E785 Hyperlipidemia, unspecified: Secondary | ICD-10-CM | POA: Diagnosis not present

## 2022-09-01 DIAGNOSIS — R072 Precordial pain: Secondary | ICD-10-CM

## 2022-09-01 DIAGNOSIS — I10 Essential (primary) hypertension: Secondary | ICD-10-CM

## 2022-09-01 LAB — BASIC METABOLIC PANEL
Anion gap: 8 (ref 5–15)
BUN: 17 mg/dL (ref 8–23)
CO2: 28 mmol/L (ref 22–32)
Calcium: 9.4 mg/dL (ref 8.9–10.3)
Chloride: 106 mmol/L (ref 98–111)
Creatinine, Ser: 0.82 mg/dL (ref 0.44–1.00)
GFR, Estimated: >60 mL/min (ref >60)
Glucose, Bld: 99 mg/dL (ref 70–99)
Potassium: 4.4 mmol/L (ref 3.5–5.1)
Sodium: 142 mmol/L (ref 135–145)

## 2022-09-01 MED ORDER — METOPROLOL TARTRATE 50 MG PO TABS: ORAL_TABLET | ORAL | 0 refills | Status: DC

## 2022-09-01 NOTE — Progress Notes (Signed)
Cardiology Office Note   Date:  09/01/2022   ID:  Gina Cobb, DOB 1956/01/20, MRN [id number]  PCP:  Kandyce Rud, MD  Cardiologist:   Lorine Bears, MD   Chief Complaint  Patient presents with   New Patient (Initial Visit)    C/o occasional chest heaviness/HTN/Lightheadness. Meds reviewed verbally with pt.      History of Present Illness: Gina Cobb is a 67 y.o. female who presents to establish cardiovascular care.  She has known history of essential hypertension, hyperlipidemia and hypothyroidism.  She had abnormal stress test in 2007 which was followed by cardiac catheterization.  Cardiac catheterization showed normal coronary arteries and ejection fraction.  The images were personally reviewed by me.  Other than catheter induced spasm in the right coronary artery there was no evidence of atherosclerosis. She reports intermittent sharp chest discomfort lasting few seconds.  In addition, she has occasional substernal chest heaviness and tightness both at rest and with exertion.  She reports bilateral arm discomfort with walking. No recent cardiac evaluation.  She takes her medications regularly.  Her blood pressure is elevated today but she has much better readings at home.    Past Medical History:  Diagnosis Date   Anxiety    Chest pain    Dyspnea    Apparently had an abnormal myoview in 2007 followed by [facility] in 11/07 with EF 65%, normal coronaries. Stress echo (12/11): Baseline echo with EF 60-65%, normal diastolic function, no pulmonary hypertension, no significant valvular abnormalities; stress ECG showed ST changes but stress echo images were normal, suspect that this was a negative study   Hyperlipidemia    Hypertension    Hypothyroidism    PUD (peptic ulcer disease)    distant history    Past Surgical History:  Procedure Laterality Date   BREAST EXCISIONAL BIOPSY Left 09/2012   [facility]   CARDIAC CATHETERIZATION  2009   at [facility]- normal    ESOPHAGOGASTRODUODENOSCOPY (EGD) WITH PROPOFOL N/A 11/17/2019   Procedure: ESOPHAGOGASTRODUODENOSCOPY (EGD) WITH PROPOFOL;  Surgeon: Toney Reil, MD;  Location: [facility] ENDOSCOPY;  Service: Gastroenterology;  Laterality: N/A;   EYE SURGERY Left      Current Outpatient Medications  Medication Sig Dispense Refill   aspirin 81 MG EC tablet Take 81 mg by mouth daily.       atenolol (TENORMIN) 50 MG tablet Take 25 mg by mouth daily.       simvastatin (ZOCOR) 20 MG tablet Take 20 mg by mouth at bedtime.       SYNTHROID 88 MCG tablet Take 88 mcg by mouth every morning.     tacrolimus (PROTOPIC) 0.1 % ointment Apply 1-2 times daily as needed to ears. 30 g 2   No current facility-administered medications for this visit.    Allergies:   Patient has no known allergies.    Social History:  The patient  reports that she has never smoked. She has never used smokeless tobacco. She reports current alcohol use. She reports that she does not use drugs.   Family History:  The patient's family history includes Breast cancer (age of onset: 97) in her paternal grandmother; Breast cancer (age of onset: 42) in her sister; Heart attack (age of onset: 33) in her brother; Heart attack (age of onset: 22) in her father; Heart disease in her father; Hypertension in her mother.    ROS:  Please see the history of present illness.   Otherwise, review of systems are positive for none.  All other systems are reviewed and negative.    PHYSICAL EXAM: VS:  BP (!) 179/83 (BP Location: Right Arm, Patient Position: Sitting, Cuff Size: Normal)   Pulse 63   Ht 5\' 1"  (1.549 m)   Wt 115 lb 2 oz (52.2 kg)   SpO2 98%   BMI 21.75 kg/m  , BMI Body mass index is 21.75 kg/m. GEN: Well nourished, well developed, in no acute distress  HEENT: normal  Neck: no JVD, carotid bruits, or masses Cardiac: RRR; no murmurs, rubs, or gallops,no edema  Respiratory:  clear to auscultation bilaterally, normal work of breathing GI:  soft, nontender, nondistended, + BS MS: no deformity or atrophy  Skin: warm and dry, no rash Neuro:  Strength and sensation are intact Psych: euthymic mood, full affect   EKG:  EKG is ordered today. The ekg ordered today demonstrates normal sinus rhythm with no significant ST or T wave changes.   Recent Labs: No results found for requested labs within last 365 days.    Lipid Panel    Component Value Date/Time   CHOL 180 03/04/2010 2139   TRIG 89 03/04/2010 2139   HDL 61 03/04/2010 2139   CHOLHDL 3.0 Ratio 03/04/2010 2139   VLDL 18 03/04/2010 2139   LDLCALC 101 (H) 03/04/2010 2139      Wt Readings from Last 3 Encounters:  09/01/22 115 lb 2 oz (52.2 kg)  09/03/21 113 lb (51.3 kg)  09/01/21 106 lb 14.8 oz (48.5 kg)          09/01/2022    1:53 PM  PAD Screen  Previous PAD dx? No  Previous surgical procedure? No  Pain with walking? No  Feet/toe relief with dangling? No  Painful, non-healing ulcers? No  Extremities discolored? No      ASSESSMENT AND PLAN:  1.  Precordial chest pain: Some anginal and some atypical features: She has multiple risk factors for coronary artery disease and previous false positive stress test.  I recommend evaluation with cardiac CTA.  2.  Hyperlipidemia: Most recent LDL was 85.  If cardiac CTA shows significant atherosclerosis, we will plan on switching to a more potent statin and getting her LDL below 70.  3.  Essential hypertension: Her blood pressure is elevated today but she seems to be anxious and her home blood pressure readings are much better.  Continue to monitor for now.  She is on atenolol.    Disposition:   FU in 1 year or earlier if cardiac CTA is abnormal.  Signed,  Lorine Bears, MD  09/01/2022 2:10 PM    [facility]

## 2022-09-01 NOTE — Patient Instructions (Addendum)
Medication Instructions:  No changes *If you need a refill on your cardiac medications before your next appointment, please call your pharmacy*   Lab Work: Your provider would like for you to have the following labs: one week before your ct  If you have labs (blood work) drawn today and your tests are completely normal, you will receive your results only by: MyChart Message (if you have MyChart) OR A paper copy in the mail If you have any lab test that is abnormal or we need to change your treatment, we will call you to review the results.   Follow-Up: At [facility], you and your health needs are our priority.  As part of our continuing mission to provide you with exceptional heart care, we have created designated Provider Care Teams.  These Care Teams include your primary Cardiologist (physician) and Advanced Practice Providers (APPs -  Physician Assistants and Nurse Practitioners) who all work together to provide you with the care you need, when you need it.  We recommend signing up for the patient portal called "MyChart".  Sign up information is provided on this After Visit Summary.  MyChart is used to connect with patients for Virtual Visits (Telemedicine).  Patients are able to view lab/test results, encounter notes, upcoming appointments, etc.  Non-urgent messages can be sent to your provider as well.   To learn more about what you can do with MyChart, go to [email or web address].    Your next appointment:   12 month(s)  Provider:   You may see Dr. Kirke Corin or one of the following Advanced Practice Providers on your designated Care Team:   Nicolasa Ducking, NP Eula Listen, PA-C Cadence Fransico Michael, PA-C Charlsie Quest, NP    Other Instructions   Your cardiac CT will be scheduled at one of the below locations:   [facility] [address] [place], [place] [address] [phone]  OR  [facility] [address] [place], [place] [address] [phone]  OR   [facility] [address] [place], [place] [address] [phone]  If scheduled at [facility], please arrive at the [facility] and Children's Entrance (Entrance C2) of [facility] 30 minutes prior to test start time. You can use the FREE valet parking offered at entrance C (encouraged to control the heart rate for the test)  Proceed to the [facility] Radiology Department (first floor) to check-in and test prep.  All radiology patients and guests should use entrance C2 at [facility], accessed from [facility], even though the hospital's physical address listed is [address]    If scheduled at [facility] or [facility], please arrive 15 mins early for check-in and test prep.   Please follow these instructions carefully (unless otherwise directed):  Hold all erectile dysfunction medications at least 3 days (72 hrs) prior to test. (Ie viagra, cialis, sildenafil, tadalafil, etc) We will administer nitroglycerin during this exam.   On the Night Before the Test: Be sure to Drink plenty of water. Do not consume any caffeinated/decaffeinated beverages or chocolate 12 hours prior to your test. Do not take any antihistamines 12 hours prior to your test.   On the Day of the Test: Drink plenty of water until 1 hour prior to the test. Do not eat any food 1 hour prior to test. You may take your regular medications prior to the test.  Take  metoprolol (Lopressor) two hours prior to test. FEMALES- please wear underwire-free bra if available, avoid dresses & tight clothing Hold the Atenolol the morning for the test      After the Test: Drink plenty of water. After receiving IV contrast, you may experience a mild flushed feeling. This is normal. On occasion, you may experience a mild rash up to 24 hours after the  test. This is not dangerous. If this occurs, you can take Benadryl 25 mg and increase your fluid intake. If you experience trouble breathing, this can be serious. If it is severe call 911 IMMEDIATELY. If it is mild, please call our office. If you take any of these medications: Glipizide/Metformin, Avandament, Glucavance, please do not take 48 hours after completing test unless otherwise instructed.  We will call to schedule your test 2-4 weeks out understanding that some insurance companies will need an authorization prior to the service being performed.   For non-scheduling related questions, please contact the cardiac imaging nurse navigator should you have any questions/concerns: Rockwell Alexandria, Cardiac Imaging Nurse Navigator Larey Brick, Cardiac Imaging Nurse Navigator [facility] Direct Office Dial: [phone]   For scheduling needs, including cancellations and rescheduling, please call [place], [phone].

## 2022-09-30 ENCOUNTER — Telehealth (HOSPITAL_COMMUNITY): Payer: Self-pay | Admitting: Emergency Medicine

## 2022-09-30 NOTE — Telephone Encounter (Signed)
Attempted to call patient regarding upcoming cardiac CT appointment. °Left message on voicemail with name and callback number °Thaddaeus Granja RN Navigator Cardiac Imaging °[facility] °[phone] Office °[phone] Cell ° °

## 2022-10-01 ENCOUNTER — Ambulatory Visit
Admission: RE | Admit: 2022-10-01 | Discharge: 2022-10-01 | Disposition: A | Discharge status: Home / Self Care | Payer: Medicare Other | Source: Ambulatory Visit | Attending: Cardiovascular Disease | Admitting: Cardiovascular Disease

## 2022-10-01 DIAGNOSIS — R072 Precordial pain: Secondary | ICD-10-CM | POA: Diagnosis present

## 2022-10-01 MED ORDER — METOPROLOL TARTRATE 5 MG/5ML IV SOLN
10.0000 mg | Freq: Once | INTRAVENOUS | Status: AC
  Administered 2022-10-01: 10 mg via INTRAVENOUS

## 2022-10-01 MED ORDER — IOHEXOL 350 MG/ML SOLN
75.0000 mL | Freq: Once | INTRAVENOUS | Status: AC | PRN
  Administered 2022-10-01: 75 mL via INTRAVENOUS

## 2022-10-01 MED ORDER — NITROGLYCERIN 0.4 MG SL SUBL
0.8000 mg | SUBLINGUAL_TABLET | Freq: Once | SUBLINGUAL | Status: AC
  Administered 2022-10-01: 0.8 mg via SUBLINGUAL

## 2022-10-01 NOTE — Progress Notes (Signed)
Patient tolerated CT well. Drank water after. Vital signs stable encourage to drink water throughout day.Reasons explained and verbalized understanding. Ambulated steady gait.  

## 2022-12-19 ENCOUNTER — Emergency Department
Admission: EM | Admit: 2022-12-19 | Discharge: 2022-12-19 | Disposition: A | Discharge status: Home / Self Care | Payer: Medicare Other | Attending: Emergency Medicine | Admitting: Emergency Medicine

## 2022-12-19 ENCOUNTER — Other Ambulatory Visit: Payer: Self-pay

## 2022-12-19 DIAGNOSIS — N3001 Acute cystitis with hematuria: Secondary | ICD-10-CM

## 2022-12-19 DIAGNOSIS — R319 Hematuria, unspecified: Secondary | ICD-10-CM | POA: Diagnosis present

## 2022-12-19 LAB — URINALYSIS, ROUTINE W REFLEX MICROSCOPIC
Bilirubin Urine: NEGATIVE
Glucose, UA: NEGATIVE mg/dL
Ketones, ur: NEGATIVE mg/dL
Nitrite: NEGATIVE
Protein, ur: 100 mg/dL — AB
RBC / HPF: >50 RBC/hpf (ref 0–5)
Specific Gravity, Urine: 1.011 (ref 1.005–1.030)
WBC, UA: >50 WBC/hpf (ref 0–5)
pH: 6 (ref 5.0–8.0)

## 2022-12-19 LAB — CBC
HCT: 37.2 % (ref 36.0–46.0)
Hemoglobin: 12.2 g/dL (ref 12.0–15.0)
MCH: 29.5 pg (ref 26.0–34.0)
MCHC: 32.8 g/dL (ref 30.0–36.0)
MCV: 90.1 fL (ref 80.0–100.0)
Platelets: 244 10*3/uL (ref 150–400)
RBC: 4.13 MIL/uL (ref 3.87–5.11)
RDW: 11.9 % (ref 11.5–15.5)
WBC: 11 10*3/uL — ABNORMAL HIGH (ref 4.0–10.5)
nRBC: 0 % (ref 0.0–0.2)

## 2022-12-19 LAB — BASIC METABOLIC PANEL
Anion gap: 9 (ref 5–15)
BUN: 14 mg/dL (ref 8–23)
CO2: 27 mmol/L (ref 22–32)
Calcium: 9.2 mg/dL (ref 8.9–10.3)
Chloride: 102 mmol/L (ref 98–111)
Creatinine, Ser: 0.92 mg/dL (ref 0.44–1.00)
GFR, Estimated: >60 mL/min (ref >60)
Glucose, Bld: 93 mg/dL (ref 70–99)
Potassium: 4 mmol/L (ref 3.5–5.1)
Sodium: 138 mmol/L (ref 135–145)

## 2022-12-19 MED ORDER — CEFTRIAXONE SODIUM 1 G IJ SOLR
1.0000 g | Freq: Once | INTRAMUSCULAR | Status: AC
  Administered 2022-12-19: 1 g via INTRAMUSCULAR
  Filled 2022-12-19: qty 10

## 2022-12-19 MED ORDER — CEFUROXIME AXETIL 500 MG PO TABS: 500.0000 mg | ORAL_TABLET | Freq: Two times a day (BID) | ORAL | 0 refills | Status: AC

## 2022-12-19 NOTE — ED Notes (Signed)
 Pt denied being able to produce a urine sample at this time. Pt provided with a labeled specimen cup and instructions to return cup to triage nurse desk once it has a clean catch urine sample.

## 2022-12-19 NOTE — ED Triage Notes (Signed)
Pt to ed from home via POV for blood in her urine. Pt has had a bladder infection before and this feels the same. Pt is caox4, in no acute distress and ambulatory in triage. Pt started feeling bad a few days ago. Pt also has had HX of kidney stones.

## 2022-12-19 NOTE — ED Provider Notes (Signed)
[facility] Provider Note    Event Date/Time   First MD Initiated Contact with Patient 12/19/22 1920     (approximate)   History   Hematuria   HPI Gina Cobb is a 67 y.o. female presenting today for blood in her urine.  Patient states this morning she started having some some pain with urination and noticed blood in her urine after wiping.  She had some lower abdominal pain but otherwise denied other abdominal or back pain.  She denied any fevers or chills.  No nausea or vomiting.  Patient does report history of kidney stones in the past that have resolved without intervention.  Mainly concern for UTI today.     Physical Exam   Triage Vital Signs: ED Triage Vitals  Encounter Vitals Group     BP 12/19/22 1836 (!) 191/90     Systolic BP Percentile --      Diastolic BP Percentile --      Pulse Rate 12/19/22 1836 89     Resp 12/19/22 1836 16     Temp 12/19/22 1836 98 F (36.7 C)     Temp Source 12/19/22 1836 Oral     SpO2 12/19/22 1836 100 %     Weight --      Height 12/19/22 1837 5\' 1"  (1.549 m)     Head Circumference --      Peak Flow --      Pain Score 12/19/22 1836 2     Pain Loc --      Pain Education --      Exclude from Growth Chart --     Most recent vital signs: Vitals:   12/19/22 1836  BP: (!) 191/90  Pulse: 89  Resp: 16  Temp: 98 F (36.7 C)  SpO2: 100%   Physical Exam: I have reviewed the vital signs and nursing notes. General: Awake, alert, no acute distress.  Nontoxic appearing. Head:  Atraumatic, normocephalic.   ENT:  EOM intact, PERRL. Oral mucosa is pink and moist with no lesions. Neck: Neck is supple with full range of motion, No meningeal signs. Cardiovascular:  RRR, No murmurs. Peripheral pulses palpable and equal bilaterally. Respiratory:  Symmetrical chest wall expansion.  No rhonchi, rales, or wheezes.  Good air movement throughout.  No use of accessory muscles.   Musculoskeletal:  No cyanosis or edema. Moving  extremities with full ROM Abdomen:  Soft, nontender, nondistended.  No CVA tenderness bilaterally. Neuro:  GCS 15, moving all four extremities, interacting appropriately. Speech clear. Psych:  Calm, appropriate.   Skin:  Warm, dry, no rash.     ED Results / Procedures / Treatments   Labs (all labs ordered are listed, but only abnormal results are displayed) Labs Reviewed  URINALYSIS, ROUTINE W REFLEX MICROSCOPIC - Abnormal; Notable for the following components:      Result Value   Color, Urine YELLOW (*)    APPearance CLOUDY (*)    Hgb urine dipstick LARGE (*)    Protein, ur 100 (*)    Leukocytes,Ua LARGE (*)    Bacteria, UA RARE (*)    All other components within normal limits  CBC - Abnormal; Notable for the following components:   WBC 11.0 (*)    All other components within normal limits  BASIC METABOLIC PANEL     EKG    RADIOLOGY    PROCEDURES:  Critical Care performed: No  Procedures   MEDICATIONS ORDERED IN ED: Medications  cefTRIAXone (ROCEPHIN)  injection 1 g (has no administration in time range)     IMPRESSION / MDM / ASSESSMENT AND PLAN / ED COURSE  I reviewed the triage vital signs and the nursing notes.                              Differential diagnosis includes, but is not limited to, acute cystitis, nephrolithiasis, pyelonephritis, constipation.  Patient's presentation is most consistent with acute illness / injury with system symptoms.  Patient is a 67 year old female presenting today for dysuria and hematuria.  Laboratory workup is reassuring and vital signs are stable.  No evidence of sepsis.  Her urine does have some bacteria as well as WBCs and leukocytes in it suggesting possible UTI given her symptoms.  There is also blood in it so I cannot fully rule out that she has a kidney stone.  However, she has no CVA tenderness and vital signs are stable.  Even with a small stone, antibiotics outpatient with follow-up with her PCP would be  recommended.  I did state to patient that the only way to fully assess for a kidney stone would be a CT scan which patient states she does not want at this time.  I have instructed her that I cannot fully say she does not have a stone with her workup today but she feels comfortable treating just a UTI at this time and following up with her doctor.  I gave patient strict return precautions for any worsening symptoms which she was agreeable with.  Discharged with oral antibiotics.  The patient is on the cardiac monitor to evaluate for evidence of arrhythmia and/or significant heart rate changes. Clinical Course as of 12/19/22 2002  Sat Dec 19, 2022  1941 Urinalysis, Routine w reflex microscopic -Urine, Clean Catch(!) Some bacteria associated with WBCs and large leukocytes.  Could be indicative of UTI given pain complaints.  She has minimal right sided abdominal pain and wall blood in the urine could indicate a kidney stone, vital signs are otherwise stable and patient can be discharged with oral antibiotics with strict return precautions. [DW]  2000 Reassessed patient.  No pain symptoms or nausea symptoms at this time. [DW]    Clinical Course User Index [DW] Janith Lima, MD     FINAL CLINICAL IMPRESSION(S) / ED DIAGNOSES   Final diagnoses:  Acute cystitis with hematuria     Rx / DC Orders   ED Discharge Orders          Ordered    cefUROXime (CEFTIN) 500 MG tablet  2 times daily with meals        12/19/22 2001             Note:  This document was prepared using Dragon voice recognition software and may include unintentional dictation errors.   Janith Lima, MD 12/19/22 2004

## 2022-12-19 NOTE — Discharge Instructions (Signed)
You were seen in the emergency department today for your blood in urine.  Your labs are concerning for possible urinary tract infection.  Given some of your pain symptoms, I cannot fully rule out that this is not also a kidney stone without doing a CT.  If you notice any severe worsening abdominal pain, fevers greater than 100.5 F, or vomiting, please return immediately to the emergency department.  Please follow-up with your regular doctor within 1 week for reassessment.

## 2022-12-24 ENCOUNTER — Ambulatory Visit: Payer: Medicare Other | Admitting: Dermatology

## 2023-09-09 ENCOUNTER — Ambulatory Visit: Admitting: Dermatology

## 2023-09-09 ENCOUNTER — Encounter: Payer: Self-pay | Admitting: Dermatology

## 2023-09-09 DIAGNOSIS — R234 Changes in skin texture: Secondary | ICD-10-CM | POA: Diagnosis not present

## 2023-09-09 DIAGNOSIS — R202 Paresthesia of skin: Secondary | ICD-10-CM

## 2023-09-09 DIAGNOSIS — L853 Xerosis cutis: Secondary | ICD-10-CM | POA: Diagnosis not present

## 2023-09-09 DIAGNOSIS — L821 Other seborrheic keratosis: Secondary | ICD-10-CM | POA: Diagnosis not present

## 2023-09-09 DIAGNOSIS — R21 Rash and other nonspecific skin eruption: Secondary | ICD-10-CM

## 2023-09-09 NOTE — Patient Instructions (Addendum)
 Recommend using CeraVe SA moisturizer daily on face to help gently exfoliate the dry skin on face.   Recommend daily broad spectrum sunscreen SPF 30+ to sun-exposed areas, reapply every 2 hours as needed. Call for new or changing lesions.  Staying in the shade or wearing long sleeves, sun glasses (UVA+UVB protection) and wide brim hats (4-inch brim around the entire circumference of the hat) are also recommended for sun protection.      Due to recent changes in healthcare laws, you may see results of your pathology and/or laboratory studies on MyChart before the doctors have had a chance to review them. We understand that in some cases there may be results that are confusing or concerning to you. Please understand that not all results are received at the same time and often the doctors may need to interpret multiple results in order to provide you with the best plan of care or course of treatment. Therefore, we ask that you please give us  2 business days to thoroughly review all your results before contacting the office for clarification. Should we see a critical lab result, you will be contacted sooner.   If You Need Anything After Your Visit  If you have any questions or concerns for your doctor, please call our main line at [phone] and press option 4 to reach your doctor's medical assistant. If no one answers, please leave a voicemail as directed and we will return your call as soon as possible. Messages left after 4 pm will be answered the following business day.   You may also send us  a message via MyChart. We typically respond to MyChart messages within 1-2 business days.  For prescription refills, please ask your pharmacy to contact our office. Our fax number is [phone].  If you have an urgent issue when the clinic is closed that cannot wait until the next business day, you can page your doctor at the number below.    Please note that while we do our best to be available for  urgent issues outside of office hours, we are not available 24/7.   If you have an urgent issue and are unable to reach us , you may choose to seek medical care at your doctor's office, retail clinic, urgent care center, or emergency room.  If you have a medical emergency, please immediately call 911 or go to the emergency department.  Pager Numbers  - Dr. Bary Likes: [phone]  - Dr. Annette Barters: [phone]  - Dr. Felipe Horton: [phone]   In the event of inclement weather, please call our main line at [phone] for an update on the status of any delays or closures.  Dermatology Medication Tips: Please keep the boxes that topical medications come in in order to help keep track of the instructions about where and how to use these. Pharmacies typically print the medication instructions only on the boxes and not directly on the medication tubes.   If your medication is too expensive, please contact our office at [phone] option 4 or send us  a message through MyChart.   We are unable to tell what your co-pay for medications will be in advance as this is different depending on your insurance coverage. However, we may be able to find a substitute medication at lower cost or fill out paperwork to get insurance to cover a needed medication.   If a prior authorization is required to get your medication covered by your insurance company, please allow us  1-2 business days to complete this process.  Drug prices often vary depending on where the prescription is filled and some pharmacies may offer cheaper prices.  The website www.goodrx.com contains coupons for medications through different pharmacies. The prices here do not account for what the cost may be with help from insurance (it may be cheaper with your insurance), but the website can give you the price if you did not use any insurance.  - You can print the associated coupon and take it with your prescription to the pharmacy.  - You may also  stop by our office during regular business hours and pick up a GoodRx coupon card.  - If you need your prescription sent electronically to a different pharmacy, notify our office through [facility] or by phone at [phone] option 4.     Si Usted Necesita Algo Despus de Su Visita  Tambin puede enviarnos un mensaje a travs de Clinical cytogeneticist. Por lo general respondemos a los mensajes de MyChart en el transcurso de 1 a 2 das hbiles.  Para renovar recetas, por favor pida a su farmacia que se ponga en contacto con nuestra oficina. Franz Jacks de fax es [place] [phone].  Si tiene un asunto urgente cuando la clnica est cerrada y que no puede esperar hasta el siguiente da hbil, puede llamar/localizar a su doctor(a) al nmero que aparece a continuacin.   Por favor, tenga en cuenta que aunque hacemos todo lo posible para estar disponibles para asuntos urgentes fuera del horario de [place], no estamos disponibles las 24 horas del da, los 7 [address]  [address] de la [place].   Si tiene un problema urgente y no puede comunicarse con nosotros, puede optar por buscar atencin mdica  en el consultorio de su doctor(a), en una clnica privada, en un centro de atencin urgente o en una sala de emergencias.  Si tiene Engineer, drilling, por favor llame inmediatamente al 911 o vaya a la sala de emergencias.  Nmeros de bper  - Dr. Bary Likes: [phone]  - Dra. Annette Barters: [phone]  - Dr. Felipe Horton: [phone]   En caso de inclemencias del tiempo, por favor llame a L[place]uan Pila principal al [phone] para una actualizacin sobre el [place] de cualquier retraso o cierre.  Consejos para la medicacin en dermatologa: Por favor, guarde las cajas en las que vienen los medicamentos de uso tpico para ayudarle a seguir las instrucciones sobre dnde y cmo usarlos. Las farmacias generalmente imprimen las instrucciones del medicamento slo en las cajas y no directamente en los tubos del [place].    Si su medicamento es muy caro, por favor, pngase en contacto con Bettyjane Brunet llamando al [phone] y presione la opcin 4 o envenos un mensaje a travs de Clinical cytogeneticist.   No podemos decirle cul ser su copago por los medicamentos por adelantado ya que esto es diferente dependiendo de la cobertura de su seguro. Sin embargo, es posible que podamos encontrar un medicamento sustituto a Audiological scientist un formulario para que el seguro cubra el medicamento que se considera necesario.   Si se requiere una autorizacin previa para que su compaa de seguros [place] su medicamento, por favor permtanos de 1 a 2 das hbiles para completar este proceso.  Los precios de los medicamentos varan con frecuencia dependiendo del Environmental consultant de dnde se surte la receta y alguna farmacias pueden ofrecer precios ms baratos.  El sitio web www.goodrx.com tiene cupones para medicamentos de Health and safety inspector. Los precios aqu no tienen en cuenta lo que podra costar con la ayuda del seguro (puede ser  ms barato con su seguro), pero el sitio web puede darle el precio si no Visual merchandiser.  - Puede imprimir el cupn correspondiente y llevarlo con su receta a la farmacia.  - Tambin puede pasar por nuestra oficina durante el horario de atencin regular y Education officer, museum una tarjeta de cupones de GoodRx.  - Si necesita que su receta se enve electrnicamente a una farmacia diferente, informe a nuestra oficina a travs de MyChart de [place] o por telfono llamando al [phone] y presione la opcin 4.

## 2023-09-09 NOTE — Progress Notes (Signed)
   Follow Up Visit   Subjective  Gina Cobb is a 68 y.o. female who presents for the following: Rash. Used Neutrogena 70 spf on face and neck. Caused tiny bumps, burning, red, caused face to become puffy. Rash has resolved but now skin on face and neck feels like sandpaper. Happened ~ 1 1/2 weeks ago.   The following portions of the chart were reviewed this encounter and updated as appropriate: medications, allergies, medical history  Review of Systems:  No other skin or systemic complaints except as noted in HPI or Assessment and Plan.  Objective  Well appearing patient in no apparent distress; mood and affect are within normal limits.  A focused examination was performed of the following areas: Face, neck, legs, back, abdomen  Relevant exam findings are noted in the Assessment and Plan.    Assessment & Plan   NOTALGIA PARESTHETICA   SEBORRHEIC KERATOSES   RASH   XEROSIS CUTIS   SKIN DESQUAMATION    Hx of contact dermatitis vs PMLE, resolved; residual xerosis and desquamation Exam: diffuse fine superficial peeling/scaling of facial skin    Treatment Plan: Recommend using gentle exfoliant moisturizer. CeraVe SA   SEBORRHEIC KERATOSIS - Stuck-on, waxy, tan-brown papules and/or plaques at face, legs, back, abdomen - Benign-appearing - Discussed benign etiology and prognosis. - Observe - Call for any changes  NOTALGIA PARESTHETICA Exam: no focal lesions in affected area medial to L scapula  Plan: Chronic condition without cure secondary to pinched nerve along spine causing itching or sensation changes in an area of skin. Chronic rubbing or scratching causes darkening of the skin.  OTC treatments which can help with itch include numbing creams like pramoxine or lidocaine  which temporarily reduce itch or Capsaicin-containing creams which cause a burning sensation but which sometimes over time will reset the nerves to stop producing itch.    Return if symptoms  worsen or fail to improve.  I, Jill Parcell, CMA, am acting as scribe for Harris Liming, MD.   Documentation: I have reviewed the above documentation for accuracy and completeness, and I agree with the above.  Harris Liming, MD

## 2023-11-17 ENCOUNTER — Encounter: Payer: Self-pay | Admitting: Emergency Medicine

## 2023-11-17 ENCOUNTER — Emergency Department

## 2023-11-17 ENCOUNTER — Other Ambulatory Visit: Payer: Self-pay

## 2023-11-17 ENCOUNTER — Emergency Department
Admission: EM | Admit: 2023-11-17 | Discharge: 2023-11-17 | Disposition: A | Discharge status: Home / Self Care | Source: Ambulatory Visit | Attending: Emergency Medicine | Admitting: Emergency Medicine

## 2023-11-17 DIAGNOSIS — I10 Essential (primary) hypertension: Secondary | ICD-10-CM | POA: Diagnosis present

## 2023-11-17 DIAGNOSIS — R9431 Abnormal electrocardiogram [ECG] [EKG]: Secondary | ICD-10-CM | POA: Diagnosis not present

## 2023-11-17 DIAGNOSIS — I700 Atherosclerosis of aorta: Secondary | ICD-10-CM | POA: Diagnosis not present

## 2023-11-17 LAB — CBC
HCT: 40.9 % (ref 36.0–46.0)
Hemoglobin: 13.4 g/dL (ref 12.0–15.0)
MCH: 28.9 pg (ref 26.0–34.0)
MCHC: 32.8 g/dL (ref 30.0–36.0)
MCV: 88.1 fL (ref 80.0–100.0)
Platelets: 257 K/uL (ref 150–400)
RBC: 4.64 MIL/uL (ref 3.87–5.11)
RDW: 11.9 % (ref 11.5–15.5)
WBC: 5.3 K/uL (ref 4.0–10.5)
nRBC: 0 % (ref 0.0–0.2)

## 2023-11-17 LAB — COMPREHENSIVE METABOLIC PANEL WITH GFR
ALT: 17 U/L (ref 0–44)
AST: 27 U/L (ref 15–41)
Albumin: 4.5 g/dL (ref 3.5–5.0)
Alkaline Phosphatase: 56 U/L (ref 38–126)
Anion gap: 12 (ref 5–15)
BUN: 15 mg/dL (ref 8–23)
CO2: 26 mmol/L (ref 22–32)
Calcium: 9.5 mg/dL (ref 8.9–10.3)
Chloride: 101 mmol/L (ref 98–111)
Creatinine, Ser: 0.79 mg/dL (ref 0.44–1.00)
GFR, Estimated: >60 mL/min (ref >60)
Glucose, Bld: 111 mg/dL — ABNORMAL HIGH (ref 70–99)
Potassium: 3.7 mmol/L (ref 3.5–5.1)
Sodium: 139 mmol/L (ref 135–145)
Total Bilirubin: 0.7 mg/dL (ref 0.0–1.2)
Total Protein: 7.4 g/dL (ref 6.5–8.1)

## 2023-11-17 LAB — TROPONIN I (HIGH SENSITIVITY): Troponin I (High Sensitivity): 4 ng/L (ref <18)

## 2023-11-17 LAB — PROTIME-INR
INR: 0.9 (ref 0.8–1.2)
Prothrombin Time: 12.2 s (ref 11.4–15.2)

## 2023-11-17 LAB — APTT: aPTT: 29 s (ref 24–36)

## 2023-11-17 MED ORDER — IOHEXOL 350 MG/ML SOLN
100.0000 mL | Freq: Once | INTRAVENOUS | Status: AC | PRN
  Administered 2023-11-17: 75 mL via INTRAVENOUS

## 2023-11-17 NOTE — ED Triage Notes (Signed)
 Pt via POV from [facility]. Pt c/o HTN and her head feeling full reports that she has taken all her BP medication as prescribed. States she has been having the fullness for the past week. Pt reports some mild CP but not having any today. Pt is A&OX4 and NAD

## 2023-11-17 NOTE — ED Provider Notes (Signed)
 [facility] Provider Note    Event Date/Time   First MD Initiated Contact with Patient 11/17/23 [phone]     (approximate)   History   Hypertension   HPI  Gina Cobb is a 68 y.o. female with history of hypertension, typically well-controlled who presents with complaints of elevated blood pressure.  She is concerned because yesterday she had an episode of severe central back pressure which she felt was very deep which resolved fairly quickly but was accompanied by nausea.  Since then her blood pressure has been quite high.  She denies chest pain.  No neurodeficits.     Physical Exam   Triage Vital Signs: ED Triage Vitals  Encounter Vitals Group     BP 11/17/23 0826 (!) 211/88     Girls Systolic BP Percentile --      Girls Diastolic BP Percentile --      Boys Systolic BP Percentile --      Boys Diastolic BP Percentile --      Pulse Rate 11/17/23 0826 67     Resp 11/17/23 0826 18     Temp 11/17/23 0826 98.2 F (36.8 C)     Temp Source 11/17/23 0826 Oral     SpO2 11/17/23 0826 100 %     Weight 11/17/23 0824 50.3 kg (111 lb)     Height 11/17/23 0824 1.549 m (5' 1)     Head Circumference --      Peak Flow --      Pain Score 11/17/23 0824 0     Pain Loc --      Pain Education --      Exclude from Growth Chart --     Most recent vital signs: Vitals:   11/17/23 0930 11/17/23 1000  BP: (!) 186/89 (!) 186/79  Pulse: 67 72  Resp: 17 14  Temp:    SpO2: 99% 100%     General: Awake, no distress.  CV:  Good peripheral perfusion.  Regular rate and rhythm Resp:  Normal effort.  Clear to auscultation bilaterally Abd:  No distention.  Soft, nontender Other:  Equal pulses in the upper extremity bilaterally   ED Results / Procedures / Treatments   Labs (all labs ordered are listed, but only abnormal results are displayed) Labs Reviewed  COMPREHENSIVE METABOLIC PANEL WITH GFR - Abnormal; Notable for the following components:      Result Value    Glucose, Bld 111 (*)    All other components within normal limits  CBC  APTT  PROTIME-INR  TROPONIN I (HIGH SENSITIVITY)     EKG  ED ECG REPORT I, Lamar Price, the attending physician, personally viewed and interpreted this ECG.  Date: 11/17/2023  Rhythm: normal sinus rhythm QRS Axis: normal Intervals: normal ST/T Wave abnormalities: normal Narrative Interpretation: no evidence of acute ischemia    RADIOLOGY CT angio viewed interpreted me, no dissection, pending radiology official review    PROCEDURES:  Critical Care performed:   Procedures   MEDICATIONS ORDERED IN ED: Medications  iohexol  (OMNIPAQUE ) 350 MG/ML injection 100 mL (75 mLs Intravenous Contrast Given 11/17/23 0910)     IMPRESSION / MDM / ASSESSMENT AND PLAN / ED COURSE  I reviewed the triage vital signs and the nursing notes. Patient's presentation is most consistent with acute presentation with potential threat to life or bodily function.  Patient presents with elevated blood pressure, brief episode of back pain as described above.  Given hypertension, back pain, concern for  possible aortic dissection, have asked nurse to prioritize IV and straight to CT.  Patient had normal labs within the last month  EKG is reassuring overall, also pending high sensitive troponin  ----------------------------------------- 9:57 AM on 11/17/2023 ----------------------------------------- CT angiography is reassuring, lab work is unremarkable  Patient is feeling well has no complaints.  Blood pressure has improved without intervention  Patient has been taking only a half of atenolol, she will take a full pill moving forward until she sees her PCP.  She knows to return to the emergency department if any changes, she agrees with this plan, no indication for admission at this time      FINAL CLINICAL IMPRESSION(S) / ED DIAGNOSES   Final diagnoses:  Uncontrolled hypertension     Rx / DC Orders   ED  Discharge Orders     None        Note:  This document was prepared using Dragon voice recognition software and may include unintentional dictation errors.   Arlander Charleston, MD 11/17/23 1200

## 2023-12-28 ENCOUNTER — Encounter: Payer: Self-pay | Admitting: Cardiovascular Disease

## 2023-12-28 ENCOUNTER — Ambulatory Visit: Attending: Cardiovascular Disease | Admitting: Cardiovascular Disease

## 2023-12-28 VITALS — BP 124/64 | HR 67 | Ht 61.0 in | Wt 111.4 lb

## 2023-12-28 DIAGNOSIS — E785 Hyperlipidemia, unspecified: Secondary | ICD-10-CM | POA: Diagnosis not present

## 2023-12-28 DIAGNOSIS — I10 Essential (primary) hypertension: Secondary | ICD-10-CM

## 2023-12-28 DIAGNOSIS — R0602 Shortness of breath: Secondary | ICD-10-CM | POA: Diagnosis not present

## 2023-12-28 NOTE — Patient Instructions (Signed)
 Medication Instructions:  No changes *If you need a refill on your cardiac medications before your next appointment, please call your pharmacy*  Lab Work None ordered If you have labs (blood work) drawn today and your tests are completely normal, you will receive your results only by: MyChart Message (if you have MyChart) OR A paper copy in the mail If you have any lab test that is abnormal or we need to change your treatment, we will call you to review the results.  Testing/Procedures: Your physician has requested that you have an echocardiogram. Echocardiography is a painless test that uses sound waves to create images of your heart. It provides your doctor with information about the size and shape of your heart and how well your heart's chambers and valves are working.   You may receive an ultrasound enhancing agent through an IV if needed to better visualize your heart during the echo. This procedure takes approximately one hour.  There are no restrictions for this procedure.  This will take place at [address] [facility] [address], [place] [address]  Please note: We ask at that you not bring children with you during ultrasound (echo/ vascular) testing. Due to room size and safety concerns, children are not allowed in the ultrasound rooms during exams. Our front office staff cannot provide observation of children in our lobby area while testing is being conducted. An adult accompanying a patient to their appointment will only be allowed in the ultrasound room at the discretion of the ultrasound technician under special circumstances. We apologize for any inconvenience.   Follow-Up: At [facility], you and your health needs are our priority.  As part of our continuing mission to provide you with exceptional heart care, our providers are all part of one team.  This team includes your primary Cardiologist (physician) and Advanced Practice Providers or APPs  (Physician Assistants and Nurse Practitioners) who all work together to provide you with the care you need, when you need it.  Your next appointment:   12 month(s)  Provider:   You may see Antionette Kirks, MD or one of the following Advanced Practice Providers on your designated Care Team:   Laneta Pintos, NP Gildardo Labrador, PA-C Varney Gentleman, PA-C Cadence Hampstead, PA-C Ronald Cockayne, NP Morey Ar, NP    We recommend signing up for the patient portal called "MyChart".  Sign up information is provided on this After Visit Summary.  MyChart is used to connect with patients for Virtual Visits (Telemedicine).  Patients are able to view lab/test results, encounter notes, upcoming appointments, etc.  Non-urgent messages can be sent to your provider as well.   To learn more about what you can do with MyChart, go to [email or web address].

## 2023-12-28 NOTE — Progress Notes (Signed)
 Cardiology Office Note   Date:  12/28/2023   ID:  Gina Cobb, DOB [birth year], MRN [id number]  PCP:  Diedra Lame, MD  Cardiologist:   Deatrice Cage, MD   No chief complaint on file.     History of Present Illness: Gina Cobb is a 68 y.o. female who is here today for a follow-up visit regarding mildly elevated calcium score.    She has known history of essential hypertension, hyperlipidemia and hypothyroidism.  She had abnormal stress test in 2007 which was followed by cardiac catheterization.  Cardiac catheterization showed normal coronary arteries and ejection fraction.   Other than catheter induced spasm in the right coronary artery there was no evidence of atherosclerosis. She was seen last year for atypical chest pain.  She underwent cardiac CTA which showed a calcium score of 34 with minimal proximal LAD stenosis. She had an emergency room visit in July for elevated blood pressure.  Atenolol was gradually increased to 75 mg once daily.  In addition, amlodipine was added.  Her blood pressure improved since then.  She does not sleep well at night. She does complain of intermittent episodes of shortness of breath at rest and with exertion.   Past Medical History:  Diagnosis Date   Anxiety    Chest pain    Dyspnea    Apparently had an abnormal myoview in 2007 followed by [facility] in 11/07 with EF 65%, normal coronaries. Stress echo (12/11): Baseline echo with EF 60-65%, normal diastolic function, no pulmonary hypertension, no significant valvular abnormalities; stress ECG showed ST changes but stress echo images were normal, suspect that this was a negative study   Hyperlipidemia    Hypertension    Hypothyroidism    PUD (peptic ulcer disease)    distant history    Past Surgical History:  Procedure Laterality Date   BREAST EXCISIONAL BIOPSY Left 09/2012   [facility]   CARDIAC CATHETERIZATION  2009   at [facility]- normal   ESOPHAGOGASTRODUODENOSCOPY (EGD) WITH PROPOFOL  N/A 11/17/2019    Procedure: ESOPHAGOGASTRODUODENOSCOPY (EGD) WITH PROPOFOL ;  Surgeon: Unk Corinn Skiff, MD;  Location: [facility] ENDOSCOPY;  Service: Gastroenterology;  Laterality: N/A;   EYE SURGERY Left      Current Outpatient Medications  Medication Sig Dispense Refill   aspirin 81 MG EC tablet Take 81 mg by mouth daily.       atenolol (TENORMIN) 50 MG tablet Take 25 mg by mouth daily.       metoprolol  tartrate (LOPRESSOR ) 50 MG tablet Take one tablet two hours prior to the CT 1 tablet 0   simvastatin (ZOCOR) 20 MG tablet Take 20 mg by mouth at bedtime.       SYNTHROID 88 MCG tablet Take 88 mcg by mouth every morning.     tacrolimus  (PROTOPIC ) 0.1 % ointment Apply 1-2 times daily as needed to ears. 30 g 2   No current facility-administered medications for this visit.    Allergies:   Patient has no known allergies.    Social History:  The patient  reports that she has never smoked. She has never used smokeless tobacco. She reports current alcohol use. She reports that she does not use drugs.   Family History:  The patient's family history includes Breast cancer (age of onset: 39) in her paternal grandmother; Breast cancer (age of onset: 81) in her sister; Heart attack (age of onset: 5) in her brother; Heart attack (age of onset: 71) in her father; Heart disease in her father;  Hypertension in her mother.    ROS:  Please see the history of present illness.   Otherwise, review of systems are positive for none.   All other systems are reviewed and negative.    PHYSICAL EXAM: VS:  There were no vitals taken for this visit. , BMI There is no height or weight on file to calculate BMI. GEN: Well nourished, well developed, in no acute distress  HEENT: normal  Neck: no JVD, carotid bruits, or masses Cardiac: RRR; no murmurs, rubs, or gallops,no edema  Respiratory:  clear to auscultation bilaterally, normal work of breathing GI: soft, nontender, nondistended, + BS MS: no deformity or atrophy  Skin: warm  and dry, no rash Neuro:  Strength and sensation are intact Psych: euthymic mood, full affect   EKG:  EKG is not ordered today. I reviewed her EKG done in July which showed normal sinus rhythm with no significant ischemic changes.   Recent Labs: 11/17/2023: ALT 17; BUN 15; Creatinine, Ser 0.79; Hemoglobin 13.4; Platelets 257; Potassium 3.7; Sodium 139    Lipid Panel    Component Value Date/Time   CHOL 180 03/04/2010 2139   TRIG 89 03/04/2010 2139   HDL 61 03/04/2010 2139   CHOLHDL 3.0 Ratio 03/04/2010 2139   VLDL 18 03/04/2010 2139   LDLCALC 101 (H) 03/04/2010 2139      Wt Readings from Last 3 Encounters:  11/17/23 111 lb (50.3 kg)  09/01/22 115 lb 2 oz (52.2 kg)  09/03/21 113 lb (51.3 kg)          09/01/2022    1:53 PM  PAD Screen  Previous PAD dx? No  Previous surgical procedure? No  Pain with walking? No  Feet/toe relief with dangling? No  Painful, non-healing ulcers? No  Extremities discolored? No      ASSESSMENT AND PLAN:  1.  Shortness of breath: Intermittent episodes of unclear etiology.  I requested an echocardiogram to evaluate LV systolic function and pulmonary pressure.  2.  Hyperlipidemia: CTA last year showed mildly elevated calcium score 34 with evidence of mild plaque in the LAD that did not seem to be as obstructive.  I reviewed most recent lipid profile which showed an LDL of 98.  Given the presence of atherosclerosis, recommend a target LDL of less than 70.  Consider switching simvastatin to atorvastatin or rosuvastatin.  3.  Essential hypertension: Blood pressure improved with the addition of amlodipine and increasing atenolol.    Disposition:   FU in 1 year   Signed,  Deatrice Cage, MD  12/28/2023 1:16 PM    [facility]

## 2024-02-03 NOTE — Progress Notes (Signed)
 [facility] - [place] OFFICE NOTE   Name: Gina Cobb Date of Service: 02/03/2024   DOB: 1956/02/27 68 y.o.female With: Dannielle Ivonne Hopping, MD   Assessment & Plan (Diagnostic & Therapeutic):   Assessment & Plan Acute cystitis with hematuria  Orders: .  sulfamethoxazole-trimethoprim (BACTRIM DS,SEPTRA DS) 800-160 mg per tablet; Take 1 tablet by mouth 2 (two) times a day for 10 days.  Frequency of urination  Orders: .  Urinalysis (Clinic Performed); Future .  Urine Culture  Dysuria  Orders: .  phenazopyridine (PYRIDIUM) 200 mg tablet; Take 1 tablet (200 mg total) by mouth 3 (three) times a day as needed for bladder spasms.  C/W UTI, but ER precautions discussed for possible stone. She will see ENT for the lymph node. Chief Complaint:  Gina Cobb is a 68 y.o. female.  Patient presents with: urinary discomfort: Pt is having c/o urinary frequency,and burning with urination,and blood in urine.Pt was treated few mos ago in [place] for kidney stones.Pt is visiting  here in [place]. Blood in Urine burning with urination    History of Present Illness and Interval History:  She is in with her sister in law. She started to notice some lower abdominal discomfort and pressure yesterday. Then today, she developed some hematuria with frequency and dysuria.  She has some pain in the right flank. She has not had any fever. Her prior records are reviewed briefly. She has a hx of prior UTI's. She has also been treated for a renal stone in 2023 that she was able to pass.    Review of Systems:  Review of Systems  Constitutional:   Negative for fever.  Gastrointestinal:  Positive for abdominal pain.  Genitourinary:  Positive for dysuria, frequency, hematuria and urgency.   Musculoskeletal:  Positive for back pain.  All other systems reviewed and are negative.  Physical Exam:   Vitals:   02/03/24 1739 02/03/24 1742  BP: (!) 178/90 (!) 182/79  BP Location: Right arm Left  arm  Patient Position: Sitting   Pulse: 73   Temp: 36.7 C (98 F)   Resp: 20   Weight: 50.3 kg (111 lb) Comment: pt stated   SpO2: 97%   TempSrc: Temporal    There is no height or weight on file to calculate BMI. There is no height or weight on file to calculate BSA.  Physical Exam Vitals and nursing note reviewed.  Constitutional:      General: She is not in acute distress.    Appearance: She is well-developed and well-nourished. She is not ill-appearing.  HENT:     Head: Normocephalic and atraumatic.   Neck:   Cardiovascular:     Rate and Rhythm: Normal rate and regular rhythm.     Heart sounds: S1 normal and S2 normal.  Pulmonary:     Effort: Pulmonary effort is normal.     Breath sounds: Normal breath sounds.   Abdominal:     Tenderness: There is no right CVA tenderness or left CVA tenderness.  Musculoskeletal:       Back:  Lymphadenopathy:     Cervical: Cervical adenopathy present.  Skin:    General: Skin is warm and dry.  Neurological:     Mental Status: She is alert and alert and oriented to person, place, and time.  Psychiatric:        Mood and Affect: Mood and affect normal.     Results for orders placed or performed in visit on 02/03/24  Urinalysis (Clinic Performed)   Collection Time: 02/03/24  5:25 PM  Result Value Ref Range   Color, Urine Amber (A) Yellow, Straw, Light Yellow, Colorless, Pink   Clarity, Urine Cloudy    Specific Gravity, UA >=1.030 1.003 - 1.030   Urine PH 5.5 5.0 - 7.0 pH   Urine Glucose Negative Negative   Urine Ketones Small (A) Negative   Urine Bilirubin Small (A) Negative, 1+, 2+, 3+   Urine Blood Large (A) Negative   Protein, Ur 3+ (A) Negative   Urobilinogen, Urine  0.2 0.2, 1.0, <2.0, >8.0 E.U./dL   Urine Nitrite Positive (A) Negative   Urine Leukocytes Large (A) Negative     Morris Ivonne Hopping, MD

## 2024-02-08 ENCOUNTER — Ambulatory Visit

## 2024-02-09 NOTE — Progress Notes (Signed)
Attempted to call. No answer.

## 2024-02-24 ENCOUNTER — Ambulatory Visit: Attending: Nurse Practitioner | Admitting: Physician Assistant

## 2024-02-24 ENCOUNTER — Encounter: Payer: Self-pay | Admitting: Nurse Practitioner

## 2024-02-24 ENCOUNTER — Other Ambulatory Visit: Payer: Self-pay

## 2024-02-24 ENCOUNTER — Telehealth: Payer: Self-pay | Admitting: Cardiovascular Disease

## 2024-02-24 VITALS — BP 170/80 | HR 64 | Ht 61.0 in | Wt 112.0 lb

## 2024-02-24 DIAGNOSIS — I2510 Atherosclerotic heart disease of native coronary artery without angina pectoris: Secondary | ICD-10-CM

## 2024-02-24 DIAGNOSIS — E785 Hyperlipidemia, unspecified: Secondary | ICD-10-CM

## 2024-02-24 DIAGNOSIS — I10 Essential (primary) hypertension: Secondary | ICD-10-CM

## 2024-02-24 DIAGNOSIS — R0602 Shortness of breath: Secondary | ICD-10-CM | POA: Diagnosis not present

## 2024-02-24 MED ORDER — AMLODIPINE BESYLATE 10 MG PO TABS: 10.0000 mg | ORAL_TABLET | Freq: Every day | ORAL | 0 refills | Status: DC

## 2024-02-24 NOTE — Telephone Encounter (Signed)
 Pt c/o BP issue: STAT if pt c/o blurred vision, one-sided weakness or slurred speech  1. What are your last 5 BP readings? Top number runs from 161-180 and bottom number 84-86   2. Are you having any other symptoms (ex. Dizziness, headache, blurred vision, passed out)? Headaches, weakness, tiredness   3. What is your BP issue?

## 2024-02-24 NOTE — Progress Notes (Signed)
 Cardiology Office Note    Date:  02/24/2024   ID:  Gina Cobb, DOB 01-22-1956, MRN [id number]  PCP:  Diedra Lame, MD  Cardiologist:  Deatrice Cage, MD  Electrophysiologist:  None   Chief Complaint: Elevated blood pressure  History of Present Illness:   Gina Cobb is a 68 y.o. female with history of hypertension, hyperlipidemia, and hypothyroidism who presents for evaluation of elevated blood pressure.     Patient had an abnormal stress test in 2007 which was followed by cardiac catheterization which showed normal coronary arteries and EF.  She was evaluated by Dr. Rolan in 2011 for episodic shortness of breath and occasional chest tightness.  She underwent stress echocardiogram which showed some ST changes on stress ECG but normal echo imaging and overall normal study per physician overread.  There were no valvular abnormalities and no pulmonary hypertension on echo.  She was seen by [facility] cardiology in 2015 reporting chest tightness.  Repeat stress echo was obtained and normal.  Patient established with Dr. Cage 09/01/2022 with intermittent sharp chest discomfort lasting for few seconds with occasional substernal chest heaviness and tightness both with rest and exertion.  She also reported bilateral arm discomfort with walking.  Coronary CTA showed calcium score of 34 which was 66 percentile for age and sex matched control.  Minimal proximal LAD stenosis of less than 25% was noted.   Patient had ED visit for elevated blood pressure 10/2023 with atenolol increased to 75 mg once daily with the addition of amlodipine.  Patient was most recently seen by Dr. Cage 12/28/2023 and reported intermittent episodes of shortness of breath at rest and with exertion.  Echocardiogram was ordered but does not appear to be completed.  Patient presents today with ongoing concern for elevated blood pressure. She is no longer taking amlodipine which was previously prescribed for elevated blood pressure  readings as she felt the elevated pressure was related to increased stress. She reports when she was taking amlodipine her pressures were running 140s-150s systolic.  With improved BP readings, she stopped taking amlodipine. She reports home readings now ranging from 150s-180s systolic. She is concerned especially regarding elevated blood pressure readings when she first wakes up in the morning. She endorses associated headache, head pressure, ringing in her ears, fatigue, and lightheadedness. She also notes that when he blood pressure is elevated, she experiences a dropping sensation in her chest. This does not feel like palpitations or heart racing. Separately, she reports occasional feeling of the inability to take a deep breath which occurs at rest and with exertion. She denies chest pain and lower extremity swelling.   Labs independently reviewed: 10/2023-Hgb 13.4, HCT 40.9, platelets 257, Sodium 139, potassium 3.7, BUN 15, creatinine 0.79 09/2023-TC 184, TG 96, HDL 67, LDL 98, TSH low at 0.036  Objective   Past Medical History:  Diagnosis Date   Anxiety    Chest pain    Dyspnea    Apparently had an abnormal myoview in 2007 followed by [facility] in 11/07 with EF 65%, normal coronaries. Stress echo (12/11): Baseline echo with EF 60-65%, normal diastolic function, no pulmonary hypertension, no significant valvular abnormalities; stress ECG showed ST changes but stress echo images were normal, suspect that this was a negative study   Hyperlipidemia    Hypertension    Hypothyroidism    PUD (peptic ulcer disease)    distant history    Current Medications: Current Meds  Medication Sig   aspirin 81 MG EC tablet  Take 81 mg by mouth daily.     atenolol (TENORMIN) 50 MG tablet Take 25 mg by mouth daily.   (Patient taking differently: Take 1.5 mg by mouth daily.)   simvastatin (ZOCOR) 20 MG tablet Take 20 mg by mouth at bedtime.     SYNTHROID 88 MCG tablet Take 88 mcg by mouth every morning.    tacrolimus  (PROTOPIC ) 0.1 % ointment Apply 1-2 times daily as needed to ears.   [DISCONTINUED] amLODipine (NORVASC) 5 MG tablet Take 5 mg by mouth. (Patient taking differently: Take 2.5 mg by mouth daily.)    Allergies:   Patient has no known allergies.   Social History   Socioeconomic History   Marital status: Married    Spouse name: Not on file   Number of children: 2   Years of education: Not on file   Highest education level: Not on file  Occupational History   Occupation: Retail    Comment: Part Time  Tobacco Use   Smoking status: Never   Smokeless tobacco: Never  Vaping Use   Vaping status: Never Used  Substance and Sexual Activity   Alcohol use: Yes    Comment: occasional beer   Drug use: No   Sexual activity: Not on file  Other Topics Concern   Not on file  Social History Narrative   Lives in [place]. Regular exercise - walks 3x weekly   Social Drivers of Health   Financial Resource Strain: Patient Declined (10/12/2023)   Received from [facility]   Overall Financial Resource Strain (CARDIA)    Difficulty of Paying Living Expenses: Patient declined  Food Insecurity: Patient Declined (10/12/2023)   Received from [facility]   Hunger Vital Sign    Within the past 12 months, you worried that your food would run out before you got the money to buy more.: Patient declined    Within the past 12 months, the food you bought just didn't last and you didn't have money to get more.: Patient declined  Transportation Needs: Patient Declined (10/12/2023)   Received from [facility] - Transportation    In the past 12 months, has lack of transportation kept you from medical appointments or from getting medications?: Patient declined    Lack of Transportation (Non-Medical): Patient declined  Physical Activity: Not on file  Stress: Not on file  Social Connections: Not on file     Family History:  The patient's  family history includes Breast cancer (age of onset: 1) in her paternal grandmother; Breast cancer (age of onset: 86) in her sister; Heart attack (age of onset: 51) in her brother; Heart attack (age of onset: 20) in her father; Heart disease in her father; Hypertension in her mother. There is no history of Bladder Cancer, Prostate cancer, or Kidney cancer.  ROS:   12-point review of systems is negative unless otherwise noted in the HPI.  EKGs/Other Studies Reviewed:    Studies reviewed were summarized above. The additional studies were reviewed today:  09/2022 Coronary CTA 1. Coronary calcium score of 34.1. This was 66th percentile for age and sex matched control. 2. Normal coronary origin with right dominance. 3. Minimal proximal LAD stenosis (<25%). 4. CAD-RADS 1. Minimal non-obstructive CAD (0-24%). Consider non-atherosclerotic causes of chest pain. Consider preventive therapy and risk factor modification.  EKG:  EKG personally reviewed by me today EKG Interpretation Date/Time:  Thursday February 24 2024 14:27:16 EDT Ventricular Rate:  64 PR Interval:  124 QRS Duration:  78 QT Interval:  410 QTC Calculation: 422 R Axis:   58  Text Interpretation: Normal sinus rhythm Normal ECG When compared with ECG of 17-Nov-2023 08:24, No significant change was found Confirmed by Lorene Sinclair ([id number]) on 02/24/2024 2:33:15 PM  PHYSICAL EXAM:    VS:  BP (!) 170/80 (BP Location: Left Arm, Patient Position: Sitting, Cuff Size: Normal)   Pulse 64   Ht 5' 1 (1.549 m)   Wt 112 lb (50.8 kg)   BMI 21.16 kg/m   BMI: Body mass index is 21.16 kg/m.  GEN: Well nourished, well developed in no acute distress NECK: No JVD; No carotid bruits CARDIAC: RRR, no murmurs, rubs, gallops RESPIRATORY:  Clear to auscultation without rales, wheezing or rhonchi  ABDOMEN: Soft, non-tender, non-distended EXTREMITIES: No edema; No deformity  Wt Readings from Last 3 Encounters:  02/24/24 112 lb (50.8 kg)   12/28/23 111 lb 6 oz (50.5 kg)  11/17/23 111 lb (50.3 kg)                  ASSESSMENT & PLAN:   Hypertension - Patient was previously prescribed amlodipine in addition to atenolol for elevated BP. However, she stopped taking amlodipine because her BP improved. BP has been elevated per at home readings with systolic pressure 150s-180s systolic. Elevated BP is associated with headache, fatigue, lightheadedness, head pressure, and ringing in her ears, along with a dropping sensation in her chest. BP is elevated on 2 readings in office today. We discussed several options for further management of blood pressure. She would prefer to retry amlodipine at 10 mg daily in addition to atenolol 75 mg daily. She will keep and BP log for the next 2 weeks, taking BP 1 hours after morning medications and after resting for 5-10 minutes. Could consider addition of ARB or transitioning from atenolol to carvedilol in the future if BP remains elevated.   Shortness of breath - She describes this as being unable to take a deep breath. Recommend scheduled echo which was ordered at prior appointment.   Coronary calcifications Hyperlipidemia - Coronary CTA with calcium score of 34 with mild plaque in the LAD. No symptoms concerning for angina. Most recent lipid panel 09/2023 with LDL 98. She is continued on ASA 81 mg daily and simvastatin 20 mg daily. Consider transitioning to high intensity statin with LDL goal < 70.     Disposition: Start amlodipine 10 mg daily. F/u with Dr. Darron or an APP in 2 weeks.   Medication Adjustments/Labs and Tests Ordered: Current medicines are reviewed at length with the patient today.  Concerns regarding medicines are outlined above. Medication changes, Labs and Tests ordered today are summarized above and listed in the Patient Instructions accessible in Encounters.   Signed, Sinclair Lorene, PA-C 02/24/2024 3:29 PM     [facility] [place] [address]  [address] [place], [place] [address] [phone]

## 2024-02-24 NOTE — Telephone Encounter (Signed)
 Called patient regarding message - patient also states she is feeling short of breath and light headed - appt scheduled with Vivienne today

## 2024-02-24 NOTE — Patient Instructions (Addendum)
 Medication Instructions:  TAKE Amlodipine 10 mg once daily  *If you need a refill on your cardiac medications before your next appointment, please call your pharmacy*  Lab Work: None ordered If you have labs (blood work) drawn today and your tests are completely normal, you will receive your results only by: MyChart Message (if you have MyChart) OR A paper copy in the mail If you have any lab test that is abnormal or we need to change your treatment, we will call you to review the results.  Testing/Procedures: None ordered  Follow-Up: At [facility], you and your health needs are our priority.  As part of our continuing mission to provide you with exceptional heart care, our providers are all part of one team.  This team includes your primary Cardiologist (physician) and Advanced Practice Providers or APPs (Physician Assistants and Nurse Practitioners) who all work together to provide you with the care you need, when you need it.  Your next appointment:   2 week(s)  Provider:   You may see Deatrice Cage, MD or one of the following Advanced Practice Providers on your designated Care Team:   Lesley Maffucci, PA-C  We recommend signing up for the patient portal called MyChart.  Sign up information is provided on this After Visit Summary.  MyChart is used to connect with patients for Virtual Visits (Telemedicine).  Patients are able to view lab/test results, encounter notes, upcoming appointments, etc.  Non-urgent messages can be sent to your provider as well.   To learn more about what you can do with MyChart, go to [email or web address].

## 2024-03-08 ENCOUNTER — Ambulatory Visit: Attending: Nurse Practitioner | Admitting: Nurse Practitioner

## 2024-03-08 ENCOUNTER — Encounter: Payer: Self-pay | Admitting: Nurse Practitioner

## 2024-03-08 VITALS — BP 118/62 | HR 68 | Ht 61.0 in | Wt 111.2 lb

## 2024-03-08 DIAGNOSIS — I2510 Atherosclerotic heart disease of native coronary artery without angina pectoris: Secondary | ICD-10-CM

## 2024-03-08 DIAGNOSIS — E785 Hyperlipidemia, unspecified: Secondary | ICD-10-CM | POA: Diagnosis not present

## 2024-03-08 DIAGNOSIS — I10 Essential (primary) hypertension: Secondary | ICD-10-CM | POA: Diagnosis not present

## 2024-03-08 NOTE — Progress Notes (Signed)
 Office Visit    Patient Name: Gina Cobb Date of Encounter: 03/08/2024  Primary Care Provider:  Diedra Lame, MD Primary Cardiologist:  Gina Cage, MD  Cardiology APP:  Gina Lonni Ingle, NP   Chief Complaint    68 y.o. female with a history of chest pain and nonobstructive LAD disease, hypertension, hyperlipidemia, and hypothyroidism, who presents for follow-up related to HTN.  Past Medical History   Subjective   Past Medical History:  Diagnosis Date   Anxiety    Chest pain    a. 2007 - abnl MV; b. 02/2006 Cath: nl cors, EF 65%; c. 03/2010 Stress echo: Baseline echo with EF 60-65%, normal diastolic function, no pulmonary hypertension, no significant valvular abnormalities, stress ECG showed ST changes but stress echo images were normal-->low risk; d. 09/2022 Cor CTA: Ca2+ = 34.1 (66th%'ile). LM nl, LAD <25, LCX non-dom, nl, RCA dom, nl.   DOE (dyspnea on exertion)    a. 03/2010 Echo: EF 60-65%, no rwma.   Hyperlipidemia    Hypertension    Hypothyroidism    PUD (peptic ulcer disease)    distant history   Past Surgical History:  Procedure Laterality Date   BREAST EXCISIONAL BIOPSY Left 09/2012   [facility]   CARDIAC CATHETERIZATION  2009   at [facility]- normal   ESOPHAGOGASTRODUODENOSCOPY (EGD) WITH PROPOFOL  N/A 11/17/2019   Procedure: ESOPHAGOGASTRODUODENOSCOPY (EGD) WITH PROPOFOL ;  Surgeon: Gina Corinn Skiff, MD;  Location: [facility] ENDOSCOPY;  Service: Gastroenterology;  Laterality: N/A;   EYE SURGERY Left     Allergies  No Known Allergies     History of Present Illness      68 y.o. y/o female with a history of chest pain and nonobstructive LAD disease, hypertension, hyperlipidemia, and hypothyroidism.  In 2007, she had an abnormal stress test which was followed by diagnostic catheterization showing normal coronary arteries and normal LV function.  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  Stress echo in December 2011 was low risk  with normal LV function and no significant valvular disease on resting echo.  She was reevaluated for chest pain in 2024 and underwent coronary CT angiogram in June 2024 showing less than 25% LAD stenosis with otherwise normal coronary arteries and a calcium score of 34.1 (66 percentile).  In July 2025, she was evaluated in the emergency department due to elevated blood pressure.  Atenolol dose was increased to 75 mg daily and amlodipine was added with subsequent improvement in blood pressure.  In September 2025, she reported dyspnea on exertion and echocardiogram was ordered though not completed.   Gina Cobb was last seen February 24, 2024 in the setting of elevated blood pressure, especially first thing in the morning, associated with headache, head pressure, tinnitus, fatigue, lightheadedness, and a dropping sensation in her chest in the absence of tachypalpitations.  She had self discontinued amlodipine.  Blood pressure was elevated in the office and plan was made to resume amlodipine 10 mg daily and continue atenolol 75 mg daily with follow-up today.  Echo is not scheduled until December 16.  Since medication adjustment, she has been following her blood pressures at home and they have improved over the past 2 weeks.  She is 118/62 today.  She is tolerating the amlodipine well.  She notes that she is very active at home but is not necessarily routinely exercising and is interested in starting an exercise regimen as well as adjusting her diet.  We had a long conversation about this  today.  She denies chest pain, dyspnea, or palpitations. Objective   Home Medications    Current Outpatient Medications  Medication Sig Dispense Refill   amLODipine (NORVASC) 10 MG tablet Take 1 tablet (10 mg total) by mouth daily. 90 tablet 0   aspirin 81 MG EC tablet Take 81 mg by mouth daily.       atenolol (TENORMIN) 50 MG tablet Take 25 mg by mouth daily.       simvastatin (ZOCOR) 20 MG tablet Take 20 mg by mouth  at bedtime.       SYNTHROID 75 MCG tablet Take 75 mcg by mouth every morning.     tacrolimus  (PROTOPIC ) 0.1 % ointment Apply 1-2 times daily as needed to ears. 30 g 2   SYNTHROID 88 MCG tablet Take 88 mcg by mouth every morning. (Patient not taking: Reported on 03/08/2024)     No current facility-administered medications for this visit.     Physical Exam    VS:  BP 118/62 (BP Location: Left Arm, Patient Position: Sitting, Cuff Size: Normal)   Pulse 68   Ht 5' 1 (1.549 m)   Wt 111 lb 4 oz (50.5 kg)   SpO2 98%   BMI 21.02 kg/m  , BMI Body mass index is 21.02 kg/m.          GEN: Well nourished, well developed, in no acute distress. HEENT: normal. Neck: Supple, no JVD, carotid bruits, or masses. Cardiac: RRR, no murmurs, rubs, or gallops. No clubbing, cyanosis, edema.  Radials 2+/PT 2+ and equal bilaterally.  Respiratory:  Respirations regular and unlabored, clear to auscultation bilaterally. GI: Soft, nontender, nondistended, BS + x 4. MS: no deformity or atrophy. Skin: warm and dry, no rash. Neuro:  Strength and sensation are intact. Psych: Normal affect.  Accessory Clinical Findings    Lab Results  Component Value Date   WBC 5.3 11/17/2023   HGB 13.4 11/17/2023   HCT 40.9 11/17/2023   MCV 88.1 11/17/2023   PLT 257 11/17/2023   Lab Results  Component Value Date   CREATININE 0.79 11/17/2023   BUN 15 11/17/2023   NA 139 11/17/2023   K 3.7 11/17/2023   CL 101 11/17/2023   CO2 26 11/17/2023   Lab Results  Component Value Date   ALT 17 11/17/2023   AST 27 11/17/2023   ALKPHOS 56 11/17/2023   BILITOT 0.7 11/17/2023   Labs dated October 05, 2023 from Care Everywhere:  Total cholesterol 184, triglycerides 96, HDL 67.1, LDL 98 TSH 0.036    Assessment & Plan    1.  Primary hypertension: Blood pressure was elevated at last office visit prompting resumption of amlodipine with increase in dose to 10 mg daily.  Following this adjustment, blood pressure is now  well-controlled.  No changes today.  2.  Nonobstructive CAD: Prior history of chest pain with normal coronary arteries on catheterization in 2007, negative stress echo in 2011, and less than 25% LAD stenosis on coronary CT angiogram in June 2024 with a calcium score of 34.1 (66 percentile).  Patient notes that she is active without chest pain or dyspnea, though is not routinely exercising.  She reported dyspnea earlier this year and an echo was scheduled for December.  Heart rate and blood pressure now well-controlled.  See discussion below regarding lipids.  Strongly encouraged at least 150 minutes of moderate exercise weekly 2 to 3 days of resistance and balance training as well.  3.  Hyperlipidemia: LDL of 98 in June  2025.  She is currently on simvastatin 20 mg.  In the context of mild nonobstructive LAD disease and coronary calcium, we discussed LDL goal of less than 70.  We agreed to a 66-month trial of aggressive dietary adjustment toward a more plant-based diet and that if LDL still above goal at that time, would look to switch simvastatin to rosuvastatin 20 mg daily.  4.  Hypothyroidism: Last TSH was low at 0.036 in June 2025.  She is on levothyroxine and followed by primary care.  5.  Disposition: Follow-up in 6 months or sooner if necessary.  Lonni Meager, NP 03/08/2024, 9:11 AM

## 2024-03-08 NOTE — Patient Instructions (Signed)
 Medication Instructions:  Your physician recommends that you continue on your current medications as directed. Please refer to the Current Medication list given to you today.   *If you need a refill on your cardiac medications before your next appointment, please call your pharmacy*  Lab Work: None ordered at this time  Follow-Up: At [facility], you and your health needs are our priority.  As part of our continuing mission to provide you with exceptional heart care, our providers are all part of one team.  This team includes your primary Cardiologist (physician) and Advanced Practice Providers or APPs (Physician Assistants and Nurse Practitioners) who all work together to provide you with the care you need, when you need it.  Your next appointment:   6 month(s)  Provider:   You may see Deatrice Cage, MD or Lonni Meager, NP   We recommend signing up for the patient portal called MyChart.  Sign up information is provided on this After Visit Summary.  MyChart is used to connect with patients for Virtual Visits (Telemedicine).  Patients are able to view lab/test results, encounter notes, upcoming appointments, etc.  Non-urgent messages can be sent to your provider as well.   To learn more about what you can do with MyChart, go to [email or web address].

## 2024-04-11 ENCOUNTER — Ambulatory Visit: Attending: Cardiovascular Disease

## 2024-04-11 DIAGNOSIS — R0602 Shortness of breath: Secondary | ICD-10-CM

## 2024-04-11 LAB — ECHOCARDIOGRAM COMPLETE
AR max vel: 1.88 cm2
AV Area VTI: 1.7 cm2
AV Area mean vel: 1.6 cm2
AV Mean grad: 3 mmHg
AV Peak grad: 4.4 mmHg
Ao pk vel: 1.05 m/s
Area-P 1/2: 4.39 cm2
S' Lateral: 1.9 cm

## 2024-04-13 ENCOUNTER — Ambulatory Visit: Payer: Self-pay | Admitting: Cardiovascular Disease

## 2024-05-19 ENCOUNTER — Other Ambulatory Visit: Payer: Self-pay | Admitting: Physician Assistant
